# Patient Record
Sex: Male | Born: 1960 | Hispanic: Refuse to answer | Marital: Married | Smoking: Never smoker
Health system: Southern US, Community
[De-identification: ages and names within clinical notes are randomized; demographics above are authoritative.]

## PROBLEM LIST (undated history)

## (undated) DIAGNOSIS — G709 Myoneural disorder, unspecified: Secondary | ICD-10-CM

## (undated) DIAGNOSIS — I10 Essential (primary) hypertension: Secondary | ICD-10-CM

## (undated) HISTORY — DX: Essential (primary) hypertension: I10

## (undated) HISTORY — DX: Myoneural disorder, unspecified: G70.9

---

## 2008-02-03 ENCOUNTER — Emergency Department (HOSPITAL_BASED_OUTPATIENT_CLINIC_OR_DEPARTMENT_OTHER): Admission: EM | Admit: 2008-02-03 | Discharge: 2008-02-03 | Payer: Self-pay | Admitting: Emergency Medicine

## 2010-08-15 ENCOUNTER — Emergency Department (HOSPITAL_BASED_OUTPATIENT_CLINIC_OR_DEPARTMENT_OTHER)
Admission: EM | Admit: 2010-08-15 | Discharge: 2010-08-15 | Disposition: A | Discharge status: Home / Self Care | Payer: Self-pay | Attending: Emergency Medicine | Admitting: Emergency Medicine

## 2010-08-15 DIAGNOSIS — M25519 Pain in unspecified shoulder: Secondary | ICD-10-CM | POA: Insufficient documentation

## 2010-08-15 DIAGNOSIS — M5412 Radiculopathy, cervical region: Secondary | ICD-10-CM | POA: Insufficient documentation

## 2016-07-16 ENCOUNTER — Ambulatory Visit (INDEPENDENT_AMBULATORY_CARE_PROVIDER_SITE_OTHER): Payer: Self-pay

## 2016-07-16 ENCOUNTER — Ambulatory Visit (INDEPENDENT_AMBULATORY_CARE_PROVIDER_SITE_OTHER): Payer: Self-pay | Admitting: Physician Assistant

## 2016-07-16 VITALS — BP 168/98 | HR 85 | Temp 98.1°F

## 2016-07-16 DIAGNOSIS — R140 Abdominal distension (gaseous): Secondary | ICD-10-CM

## 2016-07-16 DIAGNOSIS — R109 Unspecified abdominal pain: Secondary | ICD-10-CM

## 2016-07-16 LAB — POCT CBC
Granulocyte percent: 50.7 % (ref 37–80)
HCT, POC: 43 % — AB (ref 43.5–53.7)
Hemoglobin: 14.5 g/dL (ref 14.1–18.1)
Lymph, poc: 1.3 (ref 0.6–3.4)
MCH, POC: 29.1 pg (ref 27–31.2)
MCHC: 33.8 g/dL (ref 31.8–35.4)
MCV: 86.2 fL (ref 80–97)
MID (cbc): 0.2 (ref 0–0.9)
MPV: 6.6 fL (ref 0–99.8)
POC Granulocyte: 1.6 — AB (ref 2–6.9)
POC LYMPH PERCENT: 41.8 % (ref 10–50)
POC MID %: 7.5 % (ref 0–12)
Platelet Count, POC: 261 10*3/uL (ref 142–424)
RBC: 4.99 M/uL (ref 4.69–6.13)
RDW, POC: 13.1 %
WBC: 3.2 10*3/uL — AB (ref 4.6–10.2)

## 2016-07-16 LAB — POCT URINALYSIS DIP (MANUAL ENTRY)
Bilirubin, UA: NEGATIVE
Blood, UA: NEGATIVE
Glucose, UA: NEGATIVE mg/dL
Ketones, POC UA: NEGATIVE mg/dL
Leukocytes, UA: NEGATIVE
Nitrite, UA: NEGATIVE
Protein Ur, POC: NEGATIVE mg/dL
Spec Grav, UA: 1.025 (ref 1.010–1.025)
Urobilinogen, UA: 0.2 U/dL
pH, UA: 6 (ref 5.0–8.0)

## 2016-07-16 LAB — GLUCOSE, POCT (MANUAL RESULT ENTRY): POC Glucose: 95 mg/dL (ref 70–99)

## 2016-07-16 MED ORDER — SIMETHICONE 80 MG PO TABS: 80.0000 mg | ORAL_TABLET | Freq: Three times a day (TID) | ORAL | 1 refills | Status: DC | PRN

## 2016-07-16 NOTE — Patient Instructions (Addendum)
I need you to check your blood pressure twice per week.  You can go to the pharmacy or you can purchase a blood pressure cuff.  If you find the blood pressure stays over 140/90, this is hypertension and we should treat.  I want you to return in 2-3 weeks.    Limiting dietary ingestion of known gas-producing foods such as cabbage, onions, broccoli, brussel sprouts, wheat, and potatoes should be recommended as a therapeutic trial. A low FODMAP diet is low in fermentable oligosaccharides, disaccharides, monosaccharides, and polyols. It has been demonstrated to reduce abdominal pain, bloating, and flatulence in patients with irritable bowel syndrome  Diet for Irritable Bowel Syndrome When you have irritable bowel syndrome (IBS), the foods you eat and your eating habits are very important. IBS may cause various symptoms, such as abdominal pain, constipation, or diarrhea. Choosing the right foods can help ease discomfort caused by these symptoms. Work with your health care provider and dietitian to find the best eating plan to help control your symptoms. What general guidelines do I need to follow?  Keep a food diary. This will help you identify foods that cause symptoms. Write down:  What you eat and when.  What symptoms you have.  When symptoms occur in relation to your meals.  Avoid foods that cause symptoms. Talk with your dietitian about other ways to get the same nutrients that are in these foods.  Eat more foods that contain fiber. Take a fiber supplement if directed by your dietitian.  Eat your meals slowly, in a relaxed setting.  Aim to eat 5-6 small meals per day. Do not skip meals.  Drink enough fluids to keep your urine clear or pale yellow.  Ask your health care provider if you should take an over-the-counter probiotic during flare-ups to help restore healthy gut bacteria.  If you have cramping or diarrhea, try making your meals low in fat and high in carbohydrates. Examples of  carbohydrates are pasta, rice, whole grain breads and cereals, fruits, and vegetables.  If dairy products cause your symptoms to flare up, try eating less of them. You might be able to handle yogurt better than other dairy products because it contains bacteria that help with digestion. What foods are not recommended? The following are some foods and drinks that may worsen your symptoms:  Fatty foods, such as [place] fries.  Milk products, such as cheese or ice cream.  Chocolate.  Alcohol.  Products with caffeine, such as coffee.  Carbonated drinks, such as soda. The items listed above may not be a complete list of foods and beverages to avoid. Contact your dietitian for more information.  What foods are good sources of fiber? Your health care provider or dietitian may recommend that you eat more foods that contain fiber. Fiber can help reduce constipation and other IBS symptoms. Add foods with fiber to your diet a little at a time so that your body can get used to them. Too much fiber at once might cause gas and swelling of your abdomen. The following are some foods that are good sources of fiber:  Apples.  Peaches.  Pears.  Berries.  Figs.  Broccoli (raw).  Cabbage.  Carrots.  Raw peas.  Kidney beans.  Lima beans.  Whole grain bread.  Whole grain cereal. Where to find more information: [employer] for Functional Gastrointestinal Disorders: www.iffgd.[employer] of Diabetes and Digestive and Kidney Diseases: http://norris-lawson.com/.aspx This information is not intended to replace advice given to you by  your health care provider. Make sure you discuss any questions you have with your health care provider. Document Released: 06/05/2003 Document Revised: 08/21/2015 Document Reviewed: 06/15/2013 Elsevier Interactive Patient Education  2017 [employer].    IF you received an x-ray  today, you will receive an invoice from [facility]. Please contact [facility] at [phone] with questions or concerns regarding your invoice.   IF you received labwork today, you will receive an invoice from [facility]. Please contact [facility] at [phone] with questions or concerns regarding your invoice.   Our billing staff will not be able to assist you with questions regarding bills from these companies.  You will be contacted with the lab results as soon as they are available. The fastest way to get your results is to activate your My Chart account. Instructions are located on the last page of this paperwork. If you have not heard from [place] regarding the results in 2 weeks, please contact this office.

## 2016-07-16 NOTE — Progress Notes (Signed)
PRIMARY CARE AT [facility] [address] [place] [address] [phone]  Date:  07/16/2016   Name:  Todd Maynard   DOB:  [birth year]   MRN:  [id number]  PCP:  No PCP Per Patient    History of Present Illness:  Todd Maynard is a 56 y.o. male patient who presents to PCP with  Chief Complaint  Patient presents with  . Abdominal Pain    sharp pain in the mid of stomach; pain comes strong and goes away; denies any N/V; no issues with eating  . Breathing Problem    states when pain comes it takes his breath away     Pain at the lower belly for more than 1 year.  Sharp pain that radiates to the lower right side that may last for 10 minutes.  He may lay on his right side which may help.  No nausea, dizziness, wakness, or sob associated withy symptoms.  At least once per day.  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  No known aggravation with lifting objects.  His stomach will get tight at times following meals.  He has not recognized foods that may initiate his symptoms. Never a diagnosis of htn, though he has not been to a doctor in a long time.  He has a hx of nervousness and anxiety.  No colonoscopy screening ever performed.  Denies black or bloody stool. He is new to the area.  He is here with his younger brother, who reports that he has a hx of "nerves".  Patient states that he is nervous, but he "don't know why".  He takes nothing for this.  He was dropped off by his wife to [place], and has not returned. No weight loss No etOH use No illicit drug use.   There are no active problems to display for this patient.   Past Medical History:  Diagnosis Date  . Neuromuscular disorder (HCC)     History reviewed. No pertinent surgical history.  Social History  Substance Use Topics  . Smoking status: Never Smoker  . Smokeless tobacco: Never Used  . Alcohol use Not on file    History reviewed. No pertinent family history.  No Known Allergies  Medication list has  been reviewed and updated.  No current outpatient prescriptions on file prior to visit.   No current facility-administered medications on file prior to visit.     ROS ROS otherwise unremarkable unless listed above.  Physical Examination: BP (!) 168/98   Pulse 85   Temp 98.1 F (36.7 C) (Oral)   SpO2 98%  Ideal Body Weight:    Physical Exam  Constitutional: He is oriented to person, place, and time. He appears well-developed and well-nourished. No distress.  HENT:  Head: Normocephalic and atraumatic.  Eyes: Conjunctivae and EOM are normal. Pupils are equal, round, and reactive to light.  Cardiovascular: Normal rate and regular rhythm.  Exam reveals no friction rub.   No murmur heard. Pulses:      Carotid pulses are 2+ on the right side, and 2+ on the left side.      Radial pulses are 2+ on the right side, and 2+ on the left side.       Dorsalis pedis pulses are 2+ on the right side, and 2+ on the left side.       Posterior tibial pulses are 2+ on the right side, and 2+ on the left side.  No extremity edema  Pulmonary/Chest: Effort  normal and breath sounds normal. No respiratory distress.  Abdominal: Soft. Normal appearance, normal aorta and bowel sounds are normal. He exhibits no abdominal bruit and no ascites. There is no hepatosplenomegaly. There is no tenderness. No hernia.  Neurological: He is alert and oriented to person, place, and time.  Skin: Skin is warm and dry. He is not diaphoretic.  Psychiatric: He has a normal mood and affect. His behavior is normal.  Mildly tremulous with a mild delay of response and speech pattern.   Results for orders placed or performed in visit on 07/16/16  POCT CBC  Result Value Ref Range   WBC 3.2 (A) 4.6 - 10.2 K/uL   Lymph, poc 1.3 0.6 - 3.4   POC LYMPH PERCENT 41.8 10 - 50 %L   MID (cbc) 0.2 0 - 0.9   POC MID % 7.5 0 - 12 %M   POC Granulocyte 1.6 (A) 2 - 6.9   Granulocyte percent 50.7 37 - 80 %G   RBC 4.99 4.69 - 6.13 M/uL    Hemoglobin 14.5 14.1 - 18.1 g/dL   HCT, POC 16.1 (A) 09.6 - 53.7 %   MCV 86.2 80 - 97 fL   MCH, POC 29.1 27 - 31.2 pg   MCHC 33.8 31.8 - 35.4 g/dL   RDW, POC 04.5 %   Platelet Count, POC 261 142 - 424 K/uL   MPV 6.6 0 - 99.8 fL  POCT urinalysis dipstick  Result Value Ref Range   Color, UA yellow yellow   Clarity, UA clear clear   Glucose, UA negative negative mg/dL   Bilirubin, UA negative negative   Ketones, POC UA negative negative mg/dL   Spec Grav, UA 4.098 1.191 - 1.025   Blood, UA negative negative   pH, UA 6.0 5.0 - 8.0   Protein Ur, POC negative negative mg/dL   Urobilinogen, UA 0.2 0.2 or 1.0 E.U./dL   Nitrite, UA Negative Negative   Leukocytes, UA Negative Negative  POCT glucose (manual entry)  Result Value Ref Range   POC Glucose 95 70 - 99 mg/dl   Dg Abd 1 View  Result Date: 07/16/2016 CLINICAL DATA:  Postprandial abdominal pain for 1 year EXAM: ABDOMEN - 1 VIEW COMPARISON:  None. FINDINGS: Scattered large and small bowel gas is noted. No abnormal mass or abnormal calcifications are noted. Mild degenerative changes of the lumbar spine are seen. IMPRESSION: No acute abnormality noted. Electronically Signed   By: Alcide Clever M.D.   On: 07/16/2016 12:27     Assessment and Plan: Willaim Maynard is a 56 y.o. male who is here today for cc of abdominal pain and here with elevated bp. --likely a hx of abdominal bloating that he is stating.  Possible food allergies.  I have given him and his older brother information on a low fodmap diet to decrease bowel gas.  Foods to avoid.   He will report to me a diet diary in 2 weeks, where he can note when he has had the abdominal pain and bloating. Prescribed simethicone tid prn. Brother is looking into attempting to obtain insurance for the pt.   In that 2 weeks, we can also see what his bp is, when he returns.  Will attempt to incorporate a bb.  Difficult to note whether his affect is anxiety vs cognitive delay--or mix.  Given  diagrams for fodmap and written try NOT to eat.   Abdominal pain, unspecified abdominal location - Plan: POCT CBC, POCT urinalysis dipstick, POCT  glucose (manual entry), DG Abd 1 View, Simethicone 80 MG TABS  Bloating - Plan: Simethicone 80 MG TABS  Trena Platt, PA-C [facility] 4/22/20187:35 AM  There are no diagnoses linked to this encounter.  Trena Platt, PA-C [facility] 07/18/2016 7:31 AM

## 2016-07-28 ENCOUNTER — Ambulatory Visit (INDEPENDENT_AMBULATORY_CARE_PROVIDER_SITE_OTHER): Payer: Self-pay | Admitting: Physician Assistant

## 2016-07-28 ENCOUNTER — Encounter: Payer: Self-pay | Admitting: Physician Assistant

## 2016-07-28 VITALS — BP 164/110 | HR 85 | Temp 97.7°F | Resp 18 | Wt 178.0 lb

## 2016-07-28 DIAGNOSIS — Z1211 Encounter for screening for malignant neoplasm of colon: Secondary | ICD-10-CM

## 2016-07-28 DIAGNOSIS — R140 Abdominal distension (gaseous): Secondary | ICD-10-CM

## 2016-07-28 DIAGNOSIS — I10 Essential (primary) hypertension: Secondary | ICD-10-CM

## 2016-07-28 LAB — CMP14+EGFR
ALT: 16 [IU]/L (ref 0–44)
AST: 16 [IU]/L (ref 0–40)
Albumin/Globulin Ratio: 1.5 (ref 1.2–2.2)
Albumin: 4.1 g/dL (ref 3.5–5.5)
Alkaline Phosphatase: 75 [IU]/L (ref 39–117)
BUN/Creatinine Ratio: 10 (ref 9–20)
BUN: 17 mg/dL (ref 6–24)
Bilirubin Total: 0.4 mg/dL (ref 0.0–1.2)
CO2: 25 mmol/L (ref 18–29)
Calcium: 9.4 mg/dL (ref 8.7–10.2)
Chloride: 105 mmol/L (ref 96–106)
Creatinine, Ser: 1.68 mg/dL — ABNORMAL HIGH (ref 0.76–1.27)
GFR calc Af Amer: 51 mL/min/{1.73_m2} — ABNORMAL LOW (ref >59)
GFR calc non Af Amer: 44 mL/min/{1.73_m2} — ABNORMAL LOW (ref >59)
Globulin, Total: 2.7 g/dL (ref 1.5–4.5)
Glucose: 86 mg/dL (ref 65–99)
Potassium: 3.7 mmol/L (ref 3.5–5.2)
Sodium: 146 mmol/L — ABNORMAL HIGH (ref 134–144)
Total Protein: 6.8 g/dL (ref 6.0–8.5)

## 2016-07-28 MED ORDER — AMLODIPINE BESYLATE 5 MG PO TABS: 5.0000 mg | ORAL_TABLET | Freq: Every day | ORAL | 1 refills | Status: DC

## 2016-07-28 NOTE — Progress Notes (Signed)
PRIMARY CARE AT [facility] [address], [place] [address] [phone]  Date:  07/28/2016   Name:  Todd Maynard   DOB:  04/24/1960   MRN:  [id number]  PCP:  No PCP Per Patient    History of Present Illness:  Todd Maynard is a 56 y.o. male patient who presents to PCP with  Chief Complaint  Patient presents with  . Follow-up    2 week     Patient is here today for 2 week follow up.  Patient was here for abdominal pain and bloating.  He notes that he has had some improvement of his pain, with one time where the pain had worsened.  He is paying attention to the low fodmap diet.  He did not pick up the simethicone at this time He continues to have elevated blood pressure.  He does not watch his sodium intake, but eats no canned or frozen dinners.  Drinks a lot of sweet teas.  No cp, palpitations, sob, or leg swellings.  No dizziness, or vision changes.  Occasional headache that does last but for hours.   There are no active problems to display for this patient.   Past Medical History:  Diagnosis Date  . Neuromuscular disorder ([place])     History reviewed. No pertinent surgical history.  Social History  Substance Use Topics  . Smoking status: Never Smoker  . Smokeless tobacco: Never Used  . Alcohol use Not on file    History reviewed. No pertinent family history.  No Known Allergies  Medication list has been reviewed and updated.  Current Outpatient Prescriptions on File Prior to Visit  Medication Sig Dispense Refill  . Simethicone 80 MG TABS Take 1 tablet (80 mg total) by mouth 3 (three) times daily as needed. 90 tablet 1   No current facility-administered medications on file prior to visit.     ROS ROS otherwise unremarkable unless listed above.  Physical Examination: BP (!) 181/121   Pulse 85   Temp 97.7 F (36.5 C) (Oral)   Resp 18   Wt 178 lb (80.7 kg)   SpO2 98%  Ideal Body Weight:    Physical Exam  Constitutional: He is oriented to person, place,  and time. He appears well-developed and well-nourished. No distress.  HENT:  Head: Normocephalic and atraumatic.  Eyes: Conjunctivae and EOM are normal. Pupils are equal, round, and reactive to light.  Cardiovascular: Normal rate, regular rhythm and normal heart sounds.  Exam reveals no gallop and no friction rub.   No murmur heard. Pulses:      Radial pulses are 2+ on the right side, and 2+ on the left side.       Dorsalis pedis pulses are 2+ on the right side, and 2+ on the left side.  Pulmonary/Chest: Effort normal. No respiratory distress.  Neurological: He is alert and oriented to person, place, and time.  Skin: Skin is warm and dry. He is not diaphoretic.  Psychiatric: He has a normal mood and affect. His behavior is normal.     Assessment and Plan: Todd Maynard is a 55 y.o. male who is here today for cc of follow up and here with continued elevated bp. Will continue low fod map diet.  Brother is attempting to help patient obtain insurance.  We will go ahead and submit for colonoscopy. Starting ccb  rtc in 2 weeks for bp follow up. Essential hypertension - Plan: CMP14+EGFR, amLODipine (NORVASC) 5 MG tablet  Special screening for malignant  neoplasms, colon - Plan: Ambulatory referral to Gastroenterology  Abdominal bloating - Plan: Ambulatory referral to Gastroenterology  Ivar Drape, PA-C [facility] 5/3/20188:14 AM

## 2016-07-28 NOTE — Patient Instructions (Addendum)
I would like you to pay attention to your your sodium intake.  I want you to continue to watch what your eating to avoid abdominal bloating, but I also want you to watch sodium as well.  I am including the dash diet.  Review this, on foods NOT to eat.  I want to see you in 2 weeks to recheck your blood pressure.   DASH Eating Plan DASH stands for "Dietary Approaches to Stop Hypertension." The DASH eating plan is a healthy eating plan that has been shown to reduce high blood pressure (hypertension). It may also reduce your risk for type 2 diabetes, heart disease, and stroke. The DASH eating plan may also help with weight loss. What are tips for following this plan? General guidelines   Avoid eating more than 2,300 mg (milligrams) of salt (sodium) a day. If you have hypertension, you may need to reduce your sodium intake to 1,500 mg a day.  Limit alcohol intake to no more than 1 drink a day for nonpregnant women and 2 drinks a day for men. One drink equals 12 oz of beer, 5 oz of wine, or 1 oz of hard liquor.  Work with your health care provider to maintain a healthy body weight or to lose weight. Ask what an ideal weight is for you.  Get at least 30 minutes of exercise that causes your heart to beat faster (aerobic exercise) most days of the week. Activities may include walking, swimming, or biking.  Work with your health care provider or diet and nutrition specialist (dietitian) to adjust your eating plan to your individual calorie needs. Reading food labels   Check food labels for the amount of sodium per serving. Choose foods with less than 5 percent of the Daily Value of sodium. Generally, foods with less than 300 mg of sodium per serving fit into this eating plan.  To find whole grains, look for the word "whole" as the first word in the ingredient list. Shopping   Buy products labeled as "low-sodium" or "no salt added."  Buy fresh foods. Avoid canned foods and premade or frozen  meals. Cooking   Avoid adding salt when cooking. Use salt-free seasonings or herbs instead of table salt or sea salt. Check with your health care provider or pharmacist before using salt substitutes.  Do not fry foods. Cook foods using healthy methods such as baking, boiling, grilling, and broiling instead.  Cook with heart-healthy oils, such as olive, canola, soybean, or sunflower oil. Meal planning    Eat a balanced diet that includes:  5 or more servings of fruits and vegetables each day. At each meal, try to fill half of your plate with fruits and vegetables.  Up to 6-8 servings of whole grains each day.  Less than 6 oz of lean meat, poultry, or fish each day. A 3-oz serving of meat is about the same size as a deck of cards. One egg equals 1 oz.  2 servings of low-fat dairy each day.  A serving of nuts, seeds, or beans 5 times each week.  Heart-healthy fats. Healthy fats called Omega-3 fatty acids are found in foods such as flaxseeds and coldwater fish, like sardines, salmon, and mackerel.  Limit how much you eat of the following:  Canned or prepackaged foods.  Food that is high in trans fat, such as fried foods.  Food that is high in saturated fat, such as fatty meat.  Sweets, desserts, sugary drinks, and other foods with added sugar.  Full-fat dairy products.  Do not salt foods before eating.  Try to eat at least 2 vegetarian meals each week.  Eat more home-cooked food and less restaurant, buffet, and fast food.  When eating at a restaurant, ask that your food be prepared with less salt or no salt, if possible. What foods are recommended? The items listed may not be a complete list. Talk with your dietitian about what dietary choices are best for you. Grains  Whole-grain or whole-wheat bread. Whole-grain or whole-wheat pasta. Brown rice. [place]. Bulgur. Whole-grain and low-sodium cereals. Pita bread. Low-fat, low-sodium crackers. Whole-wheat flour  tortillas. Vegetables  Fresh or frozen vegetables (raw, steamed, roasted, or grilled). Low-sodium or reduced-sodium tomato and vegetable juice. Low-sodium or reduced-sodium tomato sauce and tomato paste. Low-sodium or reduced-sodium canned vegetables. Fruits  All fresh, dried, or frozen fruit. Canned fruit in natural juice (without added sugar). Meat and other protein foods  Skinless chicken or [place]. Ground chicken or [place]. Pork with fat trimmed off. Fish and seafood. Egg whites. Dried beans, peas, or lentils. Unsalted nuts, nut butters, and seeds. Unsalted canned beans. Lean cuts of beef with fat trimmed off. Low-sodium, lean deli meat. Dairy  Low-fat (1%) or fat-free (skim) milk. Fat-free, low-fat, or reduced-fat cheeses. Nonfat, low-sodium ricotta or cottage cheese. Low-fat or nonfat yogurt. Low-fat, low-sodium cheese. Fats and oils  Soft margarine without trans fats. Vegetable oil. Low-fat, reduced-fat, or light mayonnaise and salad dressings (reduced-sodium). Canola, safflower, olive, soybean, and sunflower oils. Avocado. Seasoning and other foods  Herbs. Spices. Seasoning mixes without salt. Unsalted popcorn and pretzels. Fat-free sweets. What foods are not recommended? The items listed may not be a complete list. Talk with your dietitian about what dietary choices are best for you. Grains  Baked goods made with fat, such as croissants, muffins, or some breads. Dry pasta or rice meal packs. Vegetables  Creamed or fried vegetables. Vegetables in a cheese sauce. Regular canned vegetables (not low-sodium or reduced-sodium). Regular canned tomato sauce and paste (not low-sodium or reduced-sodium). Regular tomato and vegetable juice (not low-sodium or reduced-sodium). Angie Fava. Olives. Fruits  Canned fruit in a light or heavy syrup. Fried fruit. Fruit in cream or butter sauce. Meat and other protein foods  Fatty cuts of meat. Ribs. Fried meat. Berniece Salines. Sausage. Bologna and other processed  lunch meats. Salami. Fatback. Hotdogs. Bratwurst. Salted nuts and seeds. Canned beans with added salt. Canned or smoked fish. Whole eggs or egg yolks. Chicken or [place] with skin. Dairy  Whole or 2% milk, cream, and half-and-half. Whole or full-fat cream cheese. Whole-fat or sweetened yogurt. Full-fat cheese. Nondairy creamers. Whipped toppings. Processed cheese and cheese spreads. Fats and oils  Butter. Stick margarine. Lard. Shortening. Ghee. Bacon fat. Tropical oils, such as coconut, palm kernel, or palm oil. Seasoning and other foods  Salted popcorn and pretzels. Onion salt, garlic salt, seasoned salt, table salt, and sea salt. Worcestershire sauce. Tartar sauce. Barbecue sauce. Teriyaki sauce. Soy sauce, including reduced-sodium. Steak sauce. Canned and packaged gravies. Fish sauce. Oyster sauce. Cocktail sauce. Horseradish that you find on the shelf. Ketchup. Mustard. Meat flavorings and tenderizers. Bouillon cubes. Hot sauce and Tabasco sauce. Premade or packaged marinades. Premade or packaged taco seasonings. Relishes. Regular salad dressings. Where to find more information:  National Heart, Lung, and [place]: https://wilson-eaton.com/  American Heart Association: www.heart.org Summary  The DASH eating plan is a healthy eating plan that has been shown to reduce high blood pressure (hypertension). It may also reduce your risk for  type 2 diabetes, heart disease, and stroke.  With the DASH eating plan, you should limit salt (sodium) intake to 2,300 mg a day. If you have hypertension, you may need to reduce your sodium intake to 1,500 mg a day.  When on the DASH eating plan, aim to eat more fresh fruits and vegetables, whole grains, lean proteins, low-fat dairy, and heart-healthy fats.  Work with your health care provider or diet and nutrition specialist (dietitian) to adjust your eating plan to your individual calorie needs. This information is not intended to replace advice given to you by  your health care provider. Make sure you discuss any questions you have with your health care provider. Document Released: 03/04/2011 Document Revised: 03/08/2016 Document Reviewed: 03/08/2016 Elsevier Interactive Patient Education  2017 [employer].       IF you received an x-ray today, you will receive an invoice from [facility]. Please contact [facility] at [phone] with questions or concerns regarding your invoice.   IF you received labwork today, you will receive an invoice from [place]. Please contact [facility] at [phone] with questions or concerns regarding your invoice.   Our billing staff will not be able to assist you with questions regarding bills from these companies.  You will be contacted with the lab results as soon as they are available. The fastest way to get your results is to activate your My Chart account. Instructions are located on the last page of this paperwork. If you have not heard from [place] regarding the results in 2 weeks, please contact this office.

## 2016-08-11 ENCOUNTER — Ambulatory Visit: Payer: Self-pay | Admitting: Physician Assistant

## 2016-08-12 ENCOUNTER — Ambulatory Visit (INDEPENDENT_AMBULATORY_CARE_PROVIDER_SITE_OTHER): Payer: Self-pay | Admitting: Physician Assistant

## 2016-08-12 ENCOUNTER — Encounter: Payer: Self-pay | Admitting: Physician Assistant

## 2016-08-12 VITALS — BP 162/98 | HR 83 | Temp 98.6°F | Resp 18 | Wt 179.0 lb

## 2016-08-12 DIAGNOSIS — R140 Abdominal distension (gaseous): Secondary | ICD-10-CM

## 2016-08-12 DIAGNOSIS — I10 Essential (primary) hypertension: Secondary | ICD-10-CM

## 2016-08-12 NOTE — Patient Instructions (Signed)
     IF you received an x-ray today, you will receive an invoice from [place] [facility]. Please contact  Radiology at [phone] with questions or concerns regarding your invoice.   IF you received labwork today, you will receive an invoice from [facility]. Please contact [facility] at [phone] with questions or concerns regarding your invoice.   Our billing staff will not be able to assist you with questions regarding bills from these companies.  You will be contacted with the lab results as soon as they are available. The fastest way to get your results is to activate your My Chart account. Instructions are located on the last page of this paperwork. If you have not heard from us regarding the results in 2 weeks, please contact this office.     

## 2016-08-14 NOTE — Progress Notes (Signed)
PRIMARY CARE AT [facility] [address], [place] [id number] [phone]  Date:  08/12/2016   Name:  Todd HoveJames Reser   DOB:  03-17-1961   MRN:  [id number]  PCP:  Patient, No Pcp Per    History of Present Illness:  Todd HoveJames Upham is a 56 y.o. male patient who presents to PCP with  Chief Complaint  Patient presents with  . Hypertension     Patient is here for blood pressure recheck, recheck of abdominla pain.  Patient recently picked up the simethicone, as they did not understand that this was at the pharmacy.  Patient states that he has no abdominal pain after starting this medication.  Patient has been compliant with taking the blood pressure medication.  He is not checking his blood pressure.   He denies any chest pains, palpitaitons, leg swellings, or vision changes.   There are no active problems to display for this patient.   Past Medical History:  Diagnosis Date  . Neuromuscular disorder (HCC)     No past surgical history on file.  Social History  Substance Use Topics  . Smoking status: Never Smoker  . Smokeless tobacco: Never Used  . Alcohol use Not on file    No family history on file.  No Known Allergies  Medication list has been reviewed and updated.  Current Outpatient Prescriptions on File Prior to Visit  Medication Sig Dispense Refill  . amLODipine (NORVASC) 5 MG tablet Take 1 tablet (5 mg total) by mouth daily. 30 tablet 1  . Simethicone 80 MG TABS Take 1 tablet (80 mg total) by mouth 3 (three) times daily as needed. 90 tablet 1   No current facility-administered medications on file prior to visit.     ROS ROS otherwise unremarkable unless listed above.  Physical Examination: BP (!) 162/98   Pulse 83   Temp 98.6 F (37 C) (Oral)   Resp 18   Wt 179 lb (81.2 kg)   SpO2 99%  Ideal Body Weight:    Physical Exam  Constitutional: He is oriented to person, place, and time. He appears well-developed and well-nourished. No distress.  HENT:  Head:  Normocephalic and atraumatic.  Eyes: Conjunctivae and EOM are normal. Pupils are equal, round, and reactive to light.  Cardiovascular: Normal rate, regular rhythm and normal heart sounds.  Exam reveals no gallop, no distant heart sounds and no friction rub.   No murmur heard. Pulses:      Carotid pulses are 2+ on the right side, and 2+ on the left side.      Radial pulses are 2+ on the right side, and 2+ on the left side.       Dorsalis pedis pulses are 2+ on the right side, and 2+ on the left side.  Pulmonary/Chest: Effort normal. No respiratory distress.  Neurological: He is alert and oriented to person, place, and time.  Skin: Skin is warm and dry. He is not diaphoretic.  Psychiatric: He has a normal mood and affect. His behavior is normal.     Assessment and Plan: Todd Maynard is a 56 y.o. male who is here today for cc of bp recheck, and abdominal pain This has resolved. bp still elevated.  There is potential that there is a white coat aspect to patient here.  Advised to check out of office and record twice per week Return with these labs. Essential hypertension  Abdominal bloating  Trena PlattStephanie Serinity Ware, PA-C [facility]  5/19/20189:43 AM

## 2016-08-26 ENCOUNTER — Telehealth: Payer: Self-pay | Admitting: Physician Assistant

## 2016-08-26 NOTE — Telephone Encounter (Signed)
Pt is having high bl pressure last week on Wed he was at 168/100 & Thurs of last week it was 158/90 so it has went down a little. He was explaining that he takes 1 amlodipine a day for the bl pressure, his brother was just concerned about the high bl pressure. Was asking if someone could give him a call or give his brother a call ASAP.   His brother is Ethelene Brownsnthony [phone]  Please advise

## 2016-08-27 NOTE — Telephone Encounter (Signed)
Feels good on med but b/p is still high.  He has been on meds x 1 month.  Denies chest pain or sob  Please advise any concerns And next ov needed

## 2016-08-31 NOTE — Telephone Encounter (Signed)
Ethelene Brownsnthony advised, he will bring his cuff here for us to compare

## 2016-08-31 NOTE — Telephone Encounter (Signed)
He should return with blood pressure cuff, and have a nurse check and insure that this is calibrated correctly.  Please have him return for this, and report the blood pressure to me.  Make sure it is appropriately sized and that he is resting more than 2 minutes, prior to blood pressure check.  Check manually.

## 2016-09-25 ENCOUNTER — Other Ambulatory Visit: Payer: Self-pay | Admitting: Physician Assistant

## 2016-09-25 DIAGNOSIS — I10 Essential (primary) hypertension: Secondary | ICD-10-CM

## 2016-09-30 ENCOUNTER — Encounter: Payer: Self-pay | Admitting: Physician Assistant

## 2016-12-25 ENCOUNTER — Other Ambulatory Visit: Payer: Self-pay | Admitting: Physician Assistant

## 2016-12-25 DIAGNOSIS — I10 Essential (primary) hypertension: Secondary | ICD-10-CM

## 2016-12-25 NOTE — Telephone Encounter (Signed)
Refill request for amlodipine 5 mg #30 with 1 refill approved.  Pt needs f/u bp ov.  Will send to schedulers to schedule appt.

## 2017-01-24 ENCOUNTER — Ambulatory Visit (INDEPENDENT_AMBULATORY_CARE_PROVIDER_SITE_OTHER): Payer: Self-pay | Admitting: Physician Assistant

## 2017-01-24 ENCOUNTER — Encounter: Payer: Self-pay | Admitting: Physician Assistant

## 2017-01-24 VITALS — BP 168/98 | HR 82 | Temp 98.4°F | Resp 16 | Ht 66.0 in | Wt 185.0 lb

## 2017-01-24 DIAGNOSIS — Z23 Encounter for immunization: Secondary | ICD-10-CM

## 2017-01-24 DIAGNOSIS — I10 Essential (primary) hypertension: Secondary | ICD-10-CM

## 2017-01-24 MED ORDER — LOSARTAN POTASSIUM 50 MG PO TABS: 50.0000 mg | ORAL_TABLET | Freq: Every day | ORAL | 2 refills | Status: DC

## 2017-01-24 MED ORDER — AMLODIPINE BESYLATE 5 MG PO TABS: ORAL_TABLET | ORAL | 2 refills | Status: DC

## 2017-01-24 NOTE — Patient Instructions (Addendum)
We are adding a blood pressure medication.  In one week, start rechecking the blood pressure.  If this continues to stay >140/90, I would like you to let me know.  We will plan to meet in 1 month for recheck of the blood pressure. We will do the losartan instead, as this may be more efficacious long term.  DASH Eating Plan DASH stands for "Dietary Approaches to Stop Hypertension." The DASH eating plan is a healthy eating plan that has been shown to reduce high blood pressure (hypertension). It may also reduce your risk for type 2 diabetes, heart disease, and stroke. The DASH eating plan may also help with weight loss. What are tips for following this plan? General guidelines  Avoid eating more than 2,300 mg (milligrams) of salt (sodium) a day. If you have hypertension, you may need to reduce your sodium intake to 1,500 mg a day.  Limit alcohol intake to no more than 1 drink a day for nonpregnant women and 2 drinks a day for men. One drink equals 12 oz of beer, 5 oz of wine, or 1 oz of hard liquor.  Work with your health care provider to maintain a healthy body weight or to lose weight. Ask what an ideal weight is for you.  Get at least 30 minutes of exercise that causes your heart to beat faster (aerobic exercise) most days of the week. Activities may include walking, swimming, or biking.  Work with your health care provider or diet and nutrition specialist (dietitian) to adjust your eating plan to your individual calorie needs. Reading food labels  Check food labels for the amount of sodium per serving. Choose foods with less than 5 percent of the Daily Value of sodium. Generally, foods with less than 300 mg of sodium per serving fit into this eating plan.  To find whole grains, look for the word "whole" as the first word in the ingredient list. Shopping  Buy products labeled as "low-sodium" or "no salt added."  Buy fresh foods. Avoid canned foods and premade or frozen  meals. Cooking  Avoid adding salt when cooking. Use salt-free seasonings or herbs instead of table salt or sea salt. Check with your health care provider or pharmacist before using salt substitutes.  Do not fry foods. Cook foods using healthy methods such as baking, boiling, grilling, and broiling instead.  Cook with heart-healthy oils, such as olive, canola, soybean, or sunflower oil. Meal planning   Eat a balanced diet that includes: ? 5 or more servings of fruits and vegetables each day. At each meal, try to fill half of your plate with fruits and vegetables. ? Up to 6-8 servings of whole grains each day. ? Less than 6 oz of lean meat, poultry, or fish each day. A 3-oz serving of meat is about the same size as a deck of cards. One egg equals 1 oz. ? 2 servings of low-fat dairy each day. ? A serving of nuts, seeds, or beans 5 times each week. ? Heart-healthy fats. Healthy fats called Omega-3 fatty acids are found in foods such as flaxseeds and coldwater fish, like sardines, salmon, and mackerel.  Limit how much you eat of the following: ? Canned or prepackaged foods. ? Food that is high in trans fat, such as fried foods. ? Food that is high in saturated fat, such as fatty meat. ? Sweets, desserts, sugary drinks, and other foods with added sugar. ? Full-fat dairy products.  Do not salt foods before eating.  Try to eat at least 2 vegetarian meals each week.  Eat more home-cooked food and less restaurant, buffet, and fast food.  When eating at a restaurant, ask that your food be prepared with less salt or no salt, if possible. What foods are recommended? The items listed may not be a complete list. Talk with your dietitian about what dietary choices are best for you. Grains Whole-grain or whole-wheat bread. Whole-grain or whole-wheat pasta. Brown rice. [place]ena Morrow. Bulgur. Whole-grain and low-sodium cereals. Pita bread. Low-fat, low-sodium crackers. Whole-wheat flour  tortillas. Vegetables Fresh or frozen vegetables (raw, steamed, roasted, or grilled). Low-sodium or reduced-sodium tomato and vegetable juice. Low-sodium or reduced-sodium tomato sauce and tomato paste. Low-sodium or reduced-sodium canned vegetables. Fruits All fresh, dried, or frozen fruit. Canned fruit in natural juice (without added sugar). Meat and other protein foods Skinless chicken or [place]. Ground chicken or [place]. Pork with fat trimmed off. Fish and seafood. Egg whites. Dried beans, peas, or lentils. Unsalted nuts, nut butters, and seeds. Unsalted canned beans. Lean cuts of beef with fat trimmed off. Low-sodium, lean deli meat. Dairy Low-fat (1%) or fat-free (skim) milk. Fat-free, low-fat, or reduced-fat cheeses. Nonfat, low-sodium ricotta or cottage cheese. Low-fat or nonfat yogurt. Low-fat, low-sodium cheese. Fats and oils Soft margarine without trans fats. Vegetable oil. Low-fat, reduced-fat, or light mayonnaise and salad dressings (reduced-sodium). Canola, safflower, olive, soybean, and sunflower oils. Avocado. Seasoning and other foods Herbs. Spices. Seasoning mixes without salt. Unsalted popcorn and pretzels. Fat-free sweets. What foods are not recommended? The items listed may not be a complete list. Talk with your dietitian about what dietary choices are best for you. Grains Baked goods made with fat, such as croissants, muffins, or some breads. Dry pasta or rice meal packs. Vegetables Creamed or fried vegetables. Vegetables in a cheese sauce. Regular canned vegetables (not low-sodium or reduced-sodium). Regular canned tomato sauce and paste (not low-sodium or reduced-sodium). Regular tomato and vegetable juice (not low-sodium or reduced-sodium). Angie Fava. Olives. Fruits Canned fruit in a light or heavy syrup. Fried fruit. Fruit in cream or butter sauce. Meat and other protein foods Fatty cuts of meat. Ribs. Fried meat. Berniece Salines. Sausage. Bologna and other processed lunch meats.  Salami. Fatback. Hotdogs. Bratwurst. Salted nuts and seeds. Canned beans with added salt. Canned or smoked fish. Whole eggs or egg yolks. Chicken or [place] with skin. Dairy Whole or 2% milk, cream, and half-and-half. Whole or full-fat cream cheese. Whole-fat or sweetened yogurt. Full-fat cheese. Nondairy creamers. Whipped toppings. Processed cheese and cheese spreads. Fats and oils Butter. Stick margarine. Lard. Shortening. Ghee. Bacon fat. Tropical oils, such as coconut, palm kernel, or palm oil. Seasoning and other foods Salted popcorn and pretzels. Onion salt, garlic salt, seasoned salt, table salt, and sea salt. Worcestershire sauce. Tartar sauce. Barbecue sauce. Teriyaki sauce. Soy sauce, including reduced-sodium. Steak sauce. Canned and packaged gravies. Fish sauce. Oyster sauce. Cocktail sauce. Horseradish that you find on the shelf. Ketchup. Mustard. Meat flavorings and tenderizers. Bouillon cubes. Hot sauce and Tabasco sauce. Premade or packaged marinades. Premade or packaged taco seasonings. Relishes. Regular salad dressings. Where to find more information:  National Heart, Lung, and [place]: https://wilson-eaton.com/  American Heart Association: www.heart.org Summary  The DASH eating plan is a healthy eating plan that has been shown to reduce high blood pressure (hypertension). It may also reduce your risk for type 2 diabetes, heart disease, and stroke.  With the DASH eating plan, you should limit salt (sodium) intake to 2,300 mg a day. If  you have hypertension, you may need to reduce your sodium intake to 1,500 mg a day.  When on the DASH eating plan, aim to eat more fresh fruits and vegetables, whole grains, lean proteins, low-fat dairy, and heart-healthy fats.  Work with your health care provider or diet and nutrition specialist (dietitian) to adjust your eating plan to your individual calorie needs. This information is not intended to replace advice given to you by your health  care provider. Make sure you discuss any questions you have with your health care provider. Document Released: 03/04/2011 Document Revised: 03/08/2016 Document Reviewed: 03/08/2016 Elsevier Interactive Patient Education  2017 [employer]    IF you received an x-ray today, you will receive an invoice from [facility]. Please contact [facility] at [phone] with questions or concerns regarding your invoice.   IF you received labwork today, you will receive an invoice from [facility]. Please contact [facility] at [phone] with questions or concerns regarding your invoice.   Our billing staff will not be able to assist you with questions regarding bills from these companies.  You will be contacted with the lab results as soon as they are available. The fastest way to get your results is to activate your My Chart account. Instructions are located on the last page of this paperwork. If you have not heard from us regarding the results in 2 weeks, please contact this office.    Losartan tablets What is this medicine? LOSARTAN (loe SAR tan) is used to treat high blood pressure and to reduce the risk of stroke in certain patients. This drug also slows the progression of kidney disease in patients with diabetes. This medicine may be used for other purposes; ask your health care provider or pharmacist if you have questions. COMMON BRAND NAME(S): Cozaar What should I tell my health care provider before I take this medicine? They need to know if you have any of these conditions: -heart failure -kidney or liver disease -an unusual or allergic reaction to losartan, other medicines, foods, dyes, or preservatives -pregnant or trying to get pregnant -breast-feeding How should I use this medicine? Take this medicine by mouth with a glass of water. Follow the directions on the prescription label. This medicine can be taken with or without food. Take your doses at regular intervals. Do  not take your medicine more often than directed. Talk to your pediatrician regarding the use of this medicine in children. Special care may be needed. Overdosage: If you think you have taken too much of this medicine contact a poison control center or emergency room at once. NOTE: This medicine is only for you. Do not share this medicine with others. What if I miss a dose? If you miss a dose, take it as soon as you can. If it is almost time for your next dose, take only that dose. Do not take double or extra doses. What may interact with this medicine? -blood pressure medicines -diuretics, especially triamterene, spironolactone, or amiloride -fluconazole -NSAIDs, medicines for pain and inflammation, like ibuprofen or naproxen -potassium salts or potassium supplements -rifampin This list may not describe all possible interactions. Give your health care provider a list of all the medicines, herbs, non-prescription drugs, or dietary supplements you use. Also tell them if you smoke, drink alcohol, or use illegal drugs. Some items may interact with your medicine. What should I watch for while using this medicine? Visit your doctor or health care professional for regular checks on your progress. Check your blood pressure  as directed. Ask your doctor or health care professional what your blood pressure should be and when you should contact him or her. Call your doctor or health care professional if you notice an irregular or fast heart beat. Women should inform their doctor if they wish to become pregnant or think they might be pregnant. There is a potential for serious side effects to an unborn child, particularly in the second or third trimester. Talk to your health care professional or pharmacist for more information. You may get drowsy or dizzy. Do not drive, use machinery, or do anything that needs mental alertness until you know how this drug affects you. Do not stand or sit up quickly, especially if  you are an older patient. This reduces the risk of dizzy or fainting spells. Alcohol can make you more drowsy and dizzy. Avoid alcoholic drinks. Avoid salt substitutes unless you are told otherwise by your doctor or health care professional. Do not treat yourself for coughs, colds, or pain while you are taking this medicine without asking your doctor or health care professional for advice. Some ingredients may increase your blood pressure. What side effects may I notice from receiving this medicine? Side effects that you should report to your doctor or health care professional as soon as possible: -confusion, dizziness, light headedness or fainting spells -decreased amount of urine passed -difficulty breathing or swallowing, hoarseness, or tightening of the throat -fast or irregular heart beat, palpitations, or chest pain -skin rash, itching -swelling of your face, lips, tongue, hands, or feet Side effects that usually do not require medical attention (report to your doctor or health care professional if they continue or are bothersome): -cough -decreased sexual function or desire -headache -nasal congestion or stuffiness -nausea or stomach pain -sore or cramping muscles This list may not describe all possible side effects. Call your doctor for medical advice about side effects. You may report side effects to FDA at 1-800-FDA-1088. Where should I keep my medicine? Keep out of the reach of children. Store at room temperature between 15 and 30 degrees C (59 and 86 degrees F). Protect from light. Keep container tightly closed. Throw away any unused medicine after the expiration date. NOTE: This sheet is a summary. It may not cover all possible information. If you have questions about this medicine, talk to your doctor, pharmacist, or health care provider.  2018 Elsevier/Gold Standard (2007-05-26 16:42:18)

## 2017-01-24 NOTE — Progress Notes (Signed)
PRIMARY CARE AT [facility] [address] [place] [address] [phone]  Date:  01/24/2017   Name:  Todd Maynard   DOB:  1960/12/12   MRN:  [id number]  PCP:  Patient, No Pcp Per    History of Present Illness:  Todd Maynard is a 56 y.o. male patient who presents to PCP with  Chief Complaint  Patient presents with  . Medication Refill    amlodipine     Patient noted that he is taking his medication compliantly.  He is here with his brother today. He states that he feels better on the medication.  He is not taking his bp at home.  His wife just moved back in who is in nursing.  He has a new cuff from his brother. Patient denies chest pains, palpitaitons, sob, or leg swelling Bike rides for about 3 hours at least 4 times per day Drinks only water No canned foods No etOH use.   Wt Readings from Last 3 Encounters:  01/24/17 185 lb (83.9 kg)  08/12/16 179 lb (81.2 kg)  07/28/16 178 lb (80.7 kg)       There are no active problems to display for this patient.   Past Medical History:  Diagnosis Date  . Neuromuscular disorder (HCC)     History reviewed. No pertinent surgical history.  Social History  Substance Use Topics  . Smoking status: Never Smoker  . Smokeless tobacco: Never Used  . Alcohol use Not on file    History reviewed. No pertinent family history.  No Known Allergies  Medication list has been reviewed and updated.  Current Outpatient Prescriptions on File Prior to Visit  Medication Sig Dispense Refill  . Simethicone 80 MG TABS Take 1 tablet (80 mg total) by mouth 3 (three) times daily as needed. 90 tablet 1   No current facility-administered medications on file prior to visit.     ROS ROS otherwise unremarkable unless listed above.  Physical Examination: BP (!) 168/98   Pulse 82   Temp 98.4 F (36.9 C) (Oral)   Resp 16   Ht 5\' 6"  (1.676 m)   Wt 185 lb (83.9 kg)   SpO2 97%   BMI 29.86 kg/m  Ideal Body Weight: Weight in (lb) to have  BMI = 25: 154.6  Physical Exam  Constitutional: He is oriented to person, place, and time. He appears well-developed and well-nourished. No distress.  HENT:  Head: Normocephalic and atraumatic.  Eyes: Pupils are equal, round, and reactive to light. Conjunctivae and EOM are normal.  Cardiovascular: Normal rate and regular rhythm.   No murmur heard. Pulmonary/Chest: Effort normal. No respiratory distress.  Neurological: He is alert and oriented to person, place, and time.  Skin: Skin is warm and dry. He is not diaphoretic.  Psychiatric: He has a normal mood and affect. His behavior is normal. Cognition and memory are impaired (minimal and his normal behaviour--slow speech.  delayed response.).     Assessment and Plan: Todd Maynard is a 56 y.o. male who is here today for cc of  Chief Complaint  Patient presents with  . Medication Refill    amlodipine  he has an anxious countenance.   However I will add a second bp medication.  Rtc in 1 month Side effects given to warrant alarming symptoms. Dash diet given Essential hypertension - Plan: amLODipine (NORVASC) 5 MG tablet, losartan (COZAAR) 50 MG tablet  Need for influenza vaccination - Plan: Flu Vaccine QUAD 36+ mos IM  Judeth Cornfield  Lenox PondsEnglish, PA-C [facility] 10/29/201810:03 AM

## 2017-02-28 ENCOUNTER — Ambulatory Visit: Payer: Self-pay | Admitting: Physician Assistant

## 2017-05-12 ENCOUNTER — Other Ambulatory Visit: Payer: Self-pay | Admitting: Physician Assistant

## 2017-05-12 DIAGNOSIS — I10 Essential (primary) hypertension: Secondary | ICD-10-CM

## 2017-06-16 ENCOUNTER — Other Ambulatory Visit: Payer: Self-pay | Admitting: Physician Assistant

## 2017-06-16 DIAGNOSIS — I10 Essential (primary) hypertension: Secondary | ICD-10-CM

## 2017-06-27 ENCOUNTER — Encounter: Payer: Self-pay | Admitting: Physician Assistant

## 2017-07-13 ENCOUNTER — Other Ambulatory Visit: Payer: Self-pay | Admitting: Physician Assistant

## 2017-07-13 DIAGNOSIS — I10 Essential (primary) hypertension: Secondary | ICD-10-CM

## 2018-05-12 IMAGING — DX DG ABDOMEN 1V
1 series · 1 of 1 positions shown · non-contrast
Comparison: None.

CLINICAL DATA: Postprandial abdominal pain for 1 year

EXAM:
ABDOMEN - 1 VIEW

[abdomen kub]
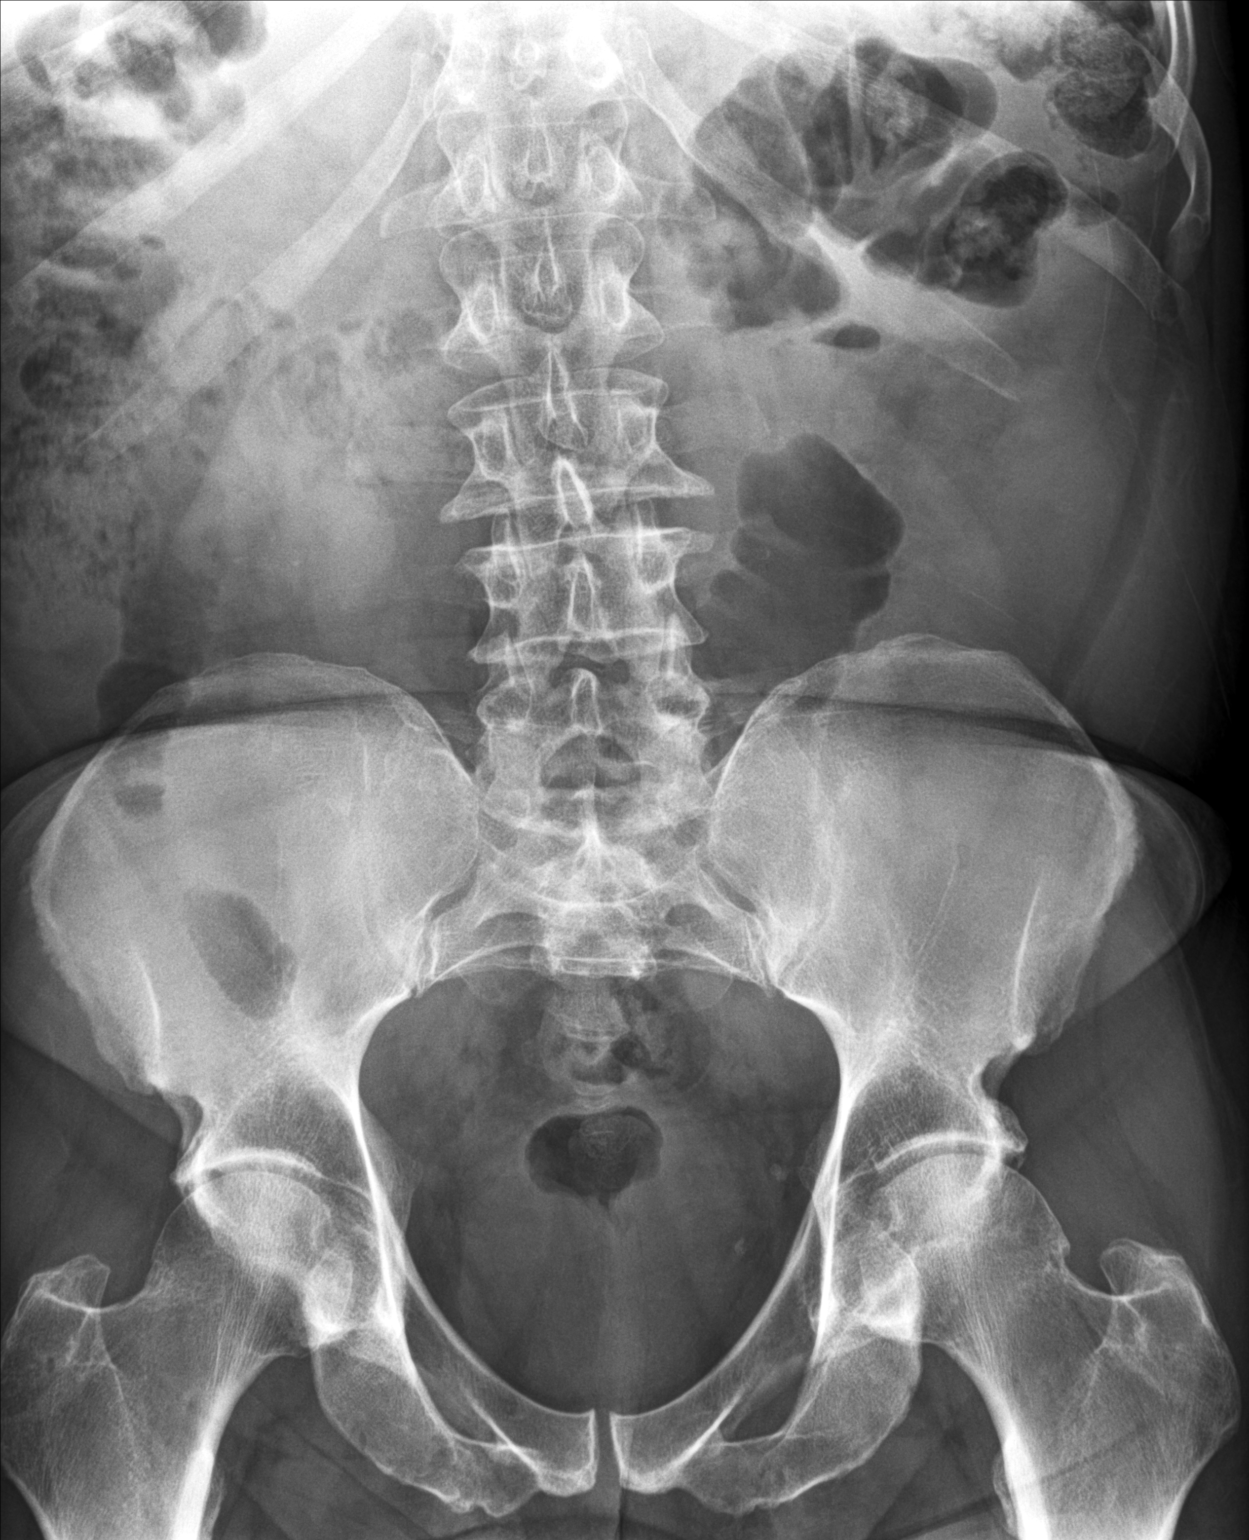

[1 of 1 positions shown; findings below may reference images not displayed]

FINDINGS: Scattered large and small bowel gas is noted. No abnormal mass or
abnormal calcifications are noted. Mild degenerative changes of the
lumbar spine are seen.
IMPRESSION: No acute abnormality noted.

## 2018-09-07 ENCOUNTER — Ambulatory Visit: Payer: Self-pay | Admitting: Family Medicine

## 2018-09-07 ENCOUNTER — Encounter: Payer: Self-pay | Admitting: Family Medicine

## 2018-09-07 ENCOUNTER — Other Ambulatory Visit: Payer: Self-pay

## 2018-09-07 VITALS — BP 202/122 | HR 93 | Temp 98.1°F | Ht 66.0 in | Wt 181.0 lb

## 2018-09-07 DIAGNOSIS — I10 Essential (primary) hypertension: Secondary | ICD-10-CM | POA: Insufficient documentation

## 2018-09-07 DIAGNOSIS — H6123 Impacted cerumen, bilateral: Secondary | ICD-10-CM | POA: Insufficient documentation

## 2018-09-07 MED ORDER — LOSARTAN POTASSIUM 50 MG PO TABS: 50.0000 mg | ORAL_TABLET | Freq: Every day | ORAL | 0 refills | Status: DC

## 2018-09-07 MED ORDER — AMLODIPINE BESYLATE 5 MG PO TABS: ORAL_TABLET | ORAL | 0 refills | Status: DC

## 2018-09-07 NOTE — Progress Notes (Signed)
Established Patient Office Visit  Subjective:  Patient ID: Todd Maynard, male    DOB: 29-Oct-1960  Age: 58 y.o. MRN: [id number]  CC:  Chief Complaint  Patient presents with  . Cerumen Impaction    both ears. cant hear well out of right side    HPI Todd Maynard presents for cerumen removal Pt is not taking blood pressure medication-no refills Pt states he has been out of medication for a year Past Medical History:  Diagnosis Date  . Neuromuscular disorder ([place])    No past surgical history on file.  No family history on file.  Social History   Socioeconomic History  . Marital status: Married    Spouse name: Not on file  . Number of children: Not on file  . Years of education: Not on file  . Highest education level: Not on file  Occupational History  . Not on file  Social Needs  . Financial resource strain: Not on file  . Food insecurity    Worry: Not on file    Inability: Not on file  . Transportation needs    Medical: Not on file    Non-medical: Not on file  Tobacco Use  . Smoking status: Never Smoker  . Smokeless tobacco: Never Used  Substance and Sexual Activity  . Alcohol use: Not on file  . Drug use: Not on file  . Sexual activity: Not on file  Lifestyle  . Physical activity    Days per week: Not on file    Minutes per session: Not on file  . Stress: Not on file  Relationships  . Social Herbalist on phone: Not on file    Gets together: Not on file    Attends religious service: Not on file    Active member of club or organization: Not on file    Attends meetings of clubs or organizations: Not on file    Relationship status: Not on file  . Intimate partner violence    Fear of current or ex partner: Not on file    Emotionally abused: Not on file    Physically abused: Not on file    Forced sexual activity: Not on file  Other Topics Concern  . Not on file  Social History Narrative  . Not on file    Outpatient Medications Prior to  Visit  Medication Sig Dispense Refill  . amLODipine (NORVASC) 5 MG tablet TAKE 1 TABLET(5 MG) BY MOUTH DAILY (Patient not taking: Reported on 09/07/2018) 30 tablet 0  . losartan (COZAAR) 50 MG tablet TAKE 1 TABLET(50 MG) BY MOUTH DAILY (Patient not taking: Reported on 09/07/2018) 30 tablet 0  . Simethicone 80 MG TABS Take 1 tablet (80 mg total) by mouth 3 (three) times daily as needed. (Patient not taking: Reported on 09/07/2018) 90 tablet 1   No facility-administered medications prior to visit.     No Known Allergies  ROS Review of Systems  Constitutional: Negative for fatigue.  HENT: Positive for ear pain. Negative for sinus pain.   Respiratory: Negative for cough and shortness of breath.   Cardiovascular: Negative for chest pain and leg swelling.      Objective:    Physical Exam  Constitutional: He appears well-developed and well-nourished. No distress.  HENT:  Head: Normocephalic and atraumatic.  Nose: Nose normal.  Mouth/Throat: No oropharyngeal exudate.  Eyes: Conjunctivae are normal.  Neck: Normal range of motion. Neck supple.  Cardiovascular: Normal rate and regular rhythm.  Pulmonary/Chest:  Effort normal and breath sounds normal.  Musculoskeletal:        General: No tenderness.  bilat cerumen-cleared with eryth canals bilat BP (!) 202/122 (BP Location: Left Arm, Patient Position: Sitting, Cuff Size: Normal)   Pulse 93   Temp 98.1 F (36.7 C) (Oral)   Ht 5' 6"  (1.676 m)   Wt 181 lb (82.1 kg)   SpO2 97%   BMI 29.21 kg/m  Wt Readings from Last 3 Encounters:  09/07/18 181 lb (82.1 kg)  01/24/17 185 lb (83.9 kg)  08/12/16 179 lb (81.2 kg)     Health Maintenance Due  Topic Date Due  . Hepatitis C Screening  06[birth year]  . HIV Screening  01/25/1974  . TETANUS/TDAP  01/25/1978  . COLONOSCOPY  01/25/2009     Lab Results  Component Value Date   WBC 3.2 (A) 07/16/2016   HGB 14.5 07/16/2016   HCT 43.0 (A) 07/16/2016   MCV 86.2 07/16/2016   Lab Results   Component Value Date   NA 146 (H) 07/28/2016   K 3.7 07/28/2016   CO2 25 07/28/2016   GLUCOSE 86 07/28/2016   BUN 17 07/28/2016   CREATININE 1.68 (H) 07/28/2016   BILITOT 0.4 07/28/2016   ALKPHOS 75 07/28/2016   AST 16 07/28/2016   ALT 16 07/28/2016   PROT 6.8 07/28/2016   ALBUMIN 4.1 07/28/2016   CALCIUM 9.4 07/28/2016     Assessment & Plan:   Problem List Items Addressed This Visit    None     1. Essential hypertension - CMP14+EGFR - TSH - amLODipine (NORVASC) 5 MG tablet; Take one po qhs  Dispense: 30 tablet; Refill: 0 - losartan (COZAAR) 50 MG tablet; Take 1 tablet (50 mg total) by mouth daily.  Dispense: 30 tablet; Refill: 0  2. Bilateral impacted cerumen Cleared bilat  F/u 2 weeks Wife to check bp at home and write down     Hannah Beat, MD

## 2018-09-07 NOTE — Patient Instructions (Signed)
Pt to restart blood pressure medication Tonight start amlodipine 5mg  and continue taking-I sent a refill to the pharmacy On Monday morning start taking the losartan and continue to take every morning- I sent your prescription to the pharmacy  I want to see you in the office again in 2 weeks to recheck your blood pressure and discuss your labwork  Have your wife check your blood pressure every morning and write it down

## 2018-09-08 LAB — CMP14+EGFR
ALT: 13 [IU]/L (ref 0–44)
AST: 17 [IU]/L (ref 0–40)
Albumin/Globulin Ratio: 1.7 (ref 1.2–2.2)
Albumin: 4.6 g/dL (ref 3.8–4.9)
Alkaline Phosphatase: 81 [IU]/L (ref 39–117)
BUN/Creatinine Ratio: 13 (ref 9–20)
BUN: 23 mg/dL (ref 6–24)
Bilirubin Total: 0.4 mg/dL (ref 0.0–1.2)
CO2: 19 mmol/L — ABNORMAL LOW (ref 20–29)
Calcium: 9.7 mg/dL (ref 8.7–10.2)
Chloride: 108 mmol/L — ABNORMAL HIGH (ref 96–106)
Creatinine, Ser: 1.8 mg/dL — ABNORMAL HIGH (ref 0.76–1.27)
GFR calc Af Amer: 47 mL/min/{1.73_m2} — ABNORMAL LOW (ref >59)
GFR calc non Af Amer: 40 mL/min/{1.73_m2} — ABNORMAL LOW (ref >59)
Globulin, Total: 2.7 g/dL (ref 1.5–4.5)
Glucose: 98 mg/dL (ref 65–99)
Potassium: 3.3 mmol/L — ABNORMAL LOW (ref 3.5–5.2)
Sodium: 143 mmol/L (ref 134–144)
Total Protein: 7.3 g/dL (ref 6.0–8.5)

## 2018-09-08 LAB — TSH: TSH: 1 u[IU]/mL (ref 0.450–4.500)

## 2018-09-12 ENCOUNTER — Other Ambulatory Visit: Payer: Self-pay | Admitting: Family Medicine

## 2018-09-12 DIAGNOSIS — N289 Disorder of kidney and ureter, unspecified: Secondary | ICD-10-CM

## 2018-09-12 DIAGNOSIS — I10 Essential (primary) hypertension: Secondary | ICD-10-CM

## 2018-09-21 ENCOUNTER — Encounter: Payer: Self-pay | Admitting: Family Medicine

## 2018-09-21 ENCOUNTER — Other Ambulatory Visit: Payer: Self-pay

## 2018-09-21 ENCOUNTER — Ambulatory Visit: Payer: Self-pay | Admitting: Family Medicine

## 2018-09-21 VITALS — BP 140/88 | HR 84 | Temp 98.2°F | Resp 16 | Ht 66.0 in | Wt 185.0 lb

## 2018-09-21 DIAGNOSIS — I10 Essential (primary) hypertension: Secondary | ICD-10-CM

## 2018-09-21 DIAGNOSIS — R9431 Abnormal electrocardiogram [ECG] [EKG]: Secondary | ICD-10-CM | POA: Insufficient documentation

## 2018-09-21 DIAGNOSIS — N289 Disorder of kidney and ureter, unspecified: Secondary | ICD-10-CM | POA: Insufficient documentation

## 2018-09-21 LAB — LIPID PANEL

## 2018-09-21 NOTE — Patient Instructions (Addendum)
If you have lab work done today you will be contacted with your lab results within the next 2 weeks.  If you have not heard from us then please contact us. The fastest way to get your results is to register for My Chart.  REFERRAL DEPT WILL CALL YOU ABOUT YOUR CARDIOLOGY APPT YOUR ECG WAS ABNORMAL IF you received an x-ray today, you will receive an invoice from [facility]. Please contact [facility] at [phone] with questions or concerns regarding your invoice.   IF you received labwork today, you will receive an invoice from [facility]. Please contact [facility] at [phone] with questions or concerns regarding your invoice.   Our billing staff will not be able to assist you with questions regarding bills from these companies.  You will be contacted with the lab results as soon as they are available. The fastest way to get your results is to activate your My Chart account. Instructions are located on the last page of this paperwork. If you have not heard from us regarding the results in 2 weeks, please contact this office.      Managing Your Hypertension Hypertension is commonly called high blood pressure. This is when the force of your blood pressing against the walls of your arteries is too strong. Arteries are blood vessels that carry blood from your heart throughout your body. Hypertension forces the heart to work harder to pump blood, and may cause the arteries to become narrow or stiff. Having untreated or uncontrolled hypertension can cause heart attack, stroke, kidney disease, and other problems. What are blood pressure readings? A blood pressure reading consists of a higher number over a lower number. Ideally, your blood pressure should be below 120/80. The first ("top") number is called the systolic pressure. It is a measure of the pressure in your arteries as your heart beats. The second ("bottom") number is called the diastolic pressure. It is a measure of  the pressure in your arteries as the heart relaxes. What does my blood pressure reading mean? Blood pressure is classified into four stages. Based on your blood pressure reading, your health care provider may use the following stages to determine what type of treatment you need, if any. Systolic pressure and diastolic pressure are measured in a unit called mm Hg. Normal  Systolic pressure: below 120.  Diastolic pressure: below 80. Elevated  Systolic pressure: 120-129.  Diastolic pressure: below 80. Hypertension stage 1  Systolic pressure: 130-139.  Diastolic pressure: 80-89. Hypertension stage 2  Systolic pressure: 140 or above.  Diastolic pressure: 90 or above. What health risks are associated with hypertension? Managing your hypertension is an important responsibility. Uncontrolled hypertension can lead to:  A heart attack.  A stroke.  A weakened blood vessel (aneurysm).  Heart failure.  Kidney damage.  Eye damage.  Metabolic syndrome.  Memory and concentration problems. What changes can I make to manage my hypertension? Hypertension can be managed by making lifestyle changes and possibly by taking medicines. Your health care provider will help you make a plan to bring your blood pressure within a normal range. Eating and drinking   Eat a diet that is high in fiber and potassium, and low in salt (sodium), added sugar, and fat. An example eating plan is called the DASH (Dietary Approaches to Stop Hypertension) diet. To eat this way: ? Eat plenty of fresh fruits and vegetables. Try to fill half of your plate at each meal with fruits and vegetables. ? Eat whole  grains, such as whole wheat pasta, brown rice, or whole grain bread. Fill about one quarter of your plate with whole grains. ? Eat low-fat diary products. ? Avoid fatty cuts of meat, processed or cured meats, and poultry with skin. Fill about one quarter of your plate with lean proteins such as fish, chicken  without skin, beans, eggs, and tofu. ? Avoid premade and processed foods. These tend to be higher in sodium, added sugar, and fat.  Reduce your daily sodium intake. Most people with hypertension should eat less than 1,500 mg of sodium a day.  Limit alcohol intake to no more than 1 drink a day for nonpregnant women and 2 drinks a day for men. One drink equals 12 oz of beer, 5 oz of wine, or 1 oz of hard liquor. Lifestyle  Work with your health care provider to maintain a healthy body weight, or to lose weight. Ask what an ideal weight is for you.  Get at least 30 minutes of exercise that causes your heart to beat faster (aerobic exercise) most days of the week. Activities may include walking, swimming, or biking.  Include exercise to strengthen your muscles (resistance exercise), such as weight lifting, as part of your weekly exercise routine. Try to do these types of exercises for 30 minutes at least 3 days a week.  Do not use any products that contain nicotine or tobacco, such as cigarettes and e-cigarettes. If you need help quitting, ask your health care provider.  Control any long-term (chronic) conditions you have, such as high cholesterol or diabetes. Monitoring  Monitor your blood pressure at home as told by your health care provider. Your personal target blood pressure may vary depending on your medical conditions, your age, and other factors.  Have your blood pressure checked regularly, as often as told by your health care provider. Working with your health care provider  Review all the medicines you take with your health care provider because there may be side effects or interactions.  Talk with your health care provider about your diet, exercise habits, and other lifestyle factors that may be contributing to hypertension.  Visit your health care provider regularly. Your health care provider can help you create and adjust your plan for managing hypertension. Will I need  medicine to control my blood pressure? Your health care provider may prescribe medicine if lifestyle changes are not enough to get your blood pressure under control, and if:  Your systolic blood pressure is 130 or higher.  Your diastolic blood pressure is 80 or higher. Take medicines only as told by your health care provider. Follow the directions carefully. Blood pressure medicines must be taken as prescribed. The medicine does not work as well when you skip doses. Skipping doses also puts you at risk for problems. Contact a health care provider if:  You think you are having a reaction to medicines you have taken.  You have repeated (recurrent) headaches.  You feel dizzy.  You have swelling in your ankles.  You have trouble with your vision. Get help right away if:  You develop a severe headache or confusion.  You have unusual weakness or numbness, or you feel faint.  You have severe pain in your chest or abdomen.  You vomit repeatedly.  You have trouble breathing. Summary  Hypertension is when the force of blood pumping through your arteries is too strong. If this condition is not controlled, it may put you at risk for serious complications.  Your personal target  blood pressure may vary depending on your medical conditions, your age, and other factors. For most people, a normal blood pressure is less than 120/80.  Hypertension is managed by lifestyle changes, medicines, or both. Lifestyle changes include weight loss, eating a healthy, low-sodium diet, exercising more, and limiting alcohol. This information is not intended to replace advice given to you by your health care provider. Make sure you discuss any questions you have with your health care provider. Document Released: 12/08/2011 Document Revised: 02/11/2016 Document Reviewed: 02/11/2016 Elsevier Interactive Patient Education  2019 [employer].

## 2018-09-21 NOTE — Progress Notes (Signed)
Acute Office Visit  Subjective:    Patient ID: Todd Maynard, male    DOB: Oct 07, 1960, 58 y.o.   MRN: [id number]  Chief Complaint  Patient presents with  . Hypertension    two week follow-up for HTN, pt states he feels better and his blood pressure is going down since starting back on medication.     HPI Patient is in today for  Hypertension-taking amlodipine and losartan daily. No headaches. No visual changes. No h/o of CP/SOB. Pt with +FH CAD. Pt with no h/o cardiac testing  Past Medical History:  Diagnosis Date  . Hypertension   . Neuromuscular disorder [facility])     Social History   Socioeconomic History  . Marital status: Married    Spouse name: Not on file  . Number of children: Not on file  . Years of education: Not on file  . Highest education level: Not on file  Occupational History  . Not on file  Social Needs  . Financial resource strain: Not on file  . Food insecurity    Worry: Not on file    Inability: Not on file  . Transportation needs    Medical: Not on file    Non-medical: Not on file  Tobacco Use  . Smoking status: Never Smoker  . Smokeless tobacco: Never Used  Substance and Sexual Activity  . Alcohol use: Not on file  . Drug use: Not on file  . Sexual activity: Not on file  Lifestyle  . Physical activity    Days per week: Not on file    Minutes per session: Not on file  . Stress: Not on file  Relationships  . Social Herbalist on phone: Not on file    Gets together: Not on file    Attends religious service: Not on file    Active member of club or organization: Not on file    Attends meetings of clubs or organizations: Not on file    Relationship status: Not on file  . Intimate partner violence    Fear of current or ex partner: Not on file    Emotionally abused: Not on file    Physically abused: Not on file    Forced sexual activity: Not on file  Other Topics Concern  . Not on file  Social History Narrative  . Not on file     Outpatient Medications Prior to Visit  Medication Sig Dispense Refill  . amLODipine (NORVASC) 5 MG tablet Take one po qhs 30 tablet 0  . losartan (COZAAR) 50 MG tablet Take 1 tablet (50 mg total) by mouth daily. 30 tablet 0  . Simethicone 80 MG TABS Take 1 tablet (80 mg total) by mouth 3 (three) times daily as needed. 90 tablet 1   No facility-administered medications prior to visit.     No Known Allergies  Review of Systems  Constitutional: Negative for fever.  Respiratory: Negative for cough.   Cardiovascular: Negative for chest pain and palpitations.  Genitourinary: Negative for urgency.  Neurological: Negative for dizziness and headaches.       Objective:    Physical Exam  Constitutional: He appears well-developed and well-nourished.  HENT:  Head: Normocephalic and atraumatic.  Eyes: Conjunctivae are normal.  Neck: Normal range of motion. Neck supple.  Cardiovascular: Normal rate, regular rhythm and normal heart sounds.  Pulmonary/Chest: Effort normal and breath sounds normal.  Neurological: He is alert.    BP 140/88   Pulse 84  Temp 98.2 F (36.8 C) (Oral)   Resp 16   Ht 5\' 6"  (1.676 m)   Wt 185 lb (83.9 kg)   SpO2 99%   BMI 29.86 kg/m  Wt Readings from Last 3 Encounters:  09/21/18 185 lb (83.9 kg)  09/07/18 181 lb (82.1 kg)  01/24/17 185 lb (83.9 kg)    Health Maintenance Due  Topic Date Due  . Hepatitis C Screening  July 09, 1960  . HIV Screening  01/25/1974  . TETANUS/TDAP  01/25/1978  . COLONOSCOPY  01/25/2009      Lab Results  Component Value Date   TSH 1.000 09/07/2018   Lab Results  Component Value Date   WBC 3.2 (A) 07/16/2016   HGB 14.5 07/16/2016   HCT 43.0 (A) 07/16/2016   MCV 86.2 07/16/2016   Lab Results  Component Value Date   NA 143 09/07/2018   K 3.3 (L) 09/07/2018   CO2 19 (L) 09/07/2018   GLUCOSE 98 09/07/2018   BUN 23 09/07/2018   CREATININE 1.80 (H) 09/07/2018   BILITOT 0.4 09/07/2018   ALKPHOS 81 09/07/2018    AST 17 09/07/2018   ALT 13 09/07/2018   PROT 7.3 09/07/2018   ALBUMIN 4.6 09/07/2018   CALCIUM 9.7 09/07/2018     Assessment & Plan:   1. Essential hypertension Continue amlodipine and losartan daily Concern for ecg changes-cardio referral - Lipid Panel - Basic Metabolic Panel - EKG 12-Lead  2. ECG abnormal Cardio referral  3. Abnormal renal function Recheck bmp-u/a  Mat CarneLEIGH , MD

## 2018-09-22 LAB — LIPID PANEL
Chol/HDL Ratio: 3.7 {ratio} (ref 0.0–5.0)
Cholesterol, Total: 165 mg/dL (ref 100–199)
HDL: 45 mg/dL (ref >39)
LDL Calculated: 102 mg/dL — ABNORMAL HIGH (ref 0–99)
Triglycerides: 91 mg/dL (ref 0–149)
VLDL Cholesterol Cal: 18 mg/dL (ref 5–40)

## 2018-09-22 LAB — BASIC METABOLIC PANEL
BUN/Creatinine Ratio: 11 (ref 9–20)
BUN: 18 mg/dL (ref 6–24)
CO2: 19 mmol/L — ABNORMAL LOW (ref 20–29)
Calcium: 9.6 mg/dL (ref 8.7–10.2)
Chloride: 108 mmol/L — ABNORMAL HIGH (ref 96–106)
Creatinine, Ser: 1.7 mg/dL — ABNORMAL HIGH (ref 0.76–1.27)
GFR calc Af Amer: 50 mL/min/{1.73_m2} — ABNORMAL LOW (ref >59)
GFR calc non Af Amer: 43 mL/min/{1.73_m2} — ABNORMAL LOW (ref >59)
Glucose: 103 mg/dL — ABNORMAL HIGH (ref 65–99)
Potassium: 3.5 mmol/L (ref 3.5–5.2)
Sodium: 144 mmol/L (ref 134–144)

## 2018-10-09 ENCOUNTER — Other Ambulatory Visit: Payer: Self-pay | Admitting: *Deleted

## 2018-10-09 DIAGNOSIS — I10 Essential (primary) hypertension: Secondary | ICD-10-CM

## 2018-10-09 MED ORDER — LOSARTAN POTASSIUM 50 MG PO TABS: 50.0000 mg | ORAL_TABLET | Freq: Every day | ORAL | 0 refills | Status: DC

## 2018-10-09 MED ORDER — AMLODIPINE BESYLATE 5 MG PO TABS: ORAL_TABLET | ORAL | 0 refills | Status: DC

## 2018-11-10 ENCOUNTER — Encounter: Payer: Self-pay | Admitting: *Deleted

## 2018-12-20 ENCOUNTER — Other Ambulatory Visit: Payer: Self-pay

## 2018-12-20 ENCOUNTER — Encounter: Payer: Self-pay | Admitting: Registered Nurse

## 2018-12-20 ENCOUNTER — Ambulatory Visit (INDEPENDENT_AMBULATORY_CARE_PROVIDER_SITE_OTHER): Payer: Self-pay | Admitting: Registered Nurse

## 2018-12-20 VITALS — BP 130/88 | HR 65 | Temp 98.3°F | Resp 16 | Ht 66.0 in | Wt 182.0 lb

## 2018-12-20 DIAGNOSIS — Z23 Encounter for immunization: Secondary | ICD-10-CM

## 2018-12-20 DIAGNOSIS — N289 Disorder of kidney and ureter, unspecified: Secondary | ICD-10-CM

## 2018-12-20 DIAGNOSIS — I10 Essential (primary) hypertension: Secondary | ICD-10-CM

## 2018-12-20 MED ORDER — LOSARTAN POTASSIUM 50 MG PO TABS: 50.0000 mg | ORAL_TABLET | Freq: Every day | ORAL | 1 refills | Status: DC

## 2018-12-20 MED ORDER — AMLODIPINE BESYLATE 5 MG PO TABS: ORAL_TABLET | ORAL | 1 refills | Status: DC

## 2018-12-20 NOTE — Progress Notes (Signed)
Established Patient Office Visit  Subjective:  Patient ID: Todd Maynard, male    DOB: 1960/12/03  Age: 58 y.o. MRN: [id number]  CC:  Chief Complaint  Patient presents with  . Hypertension    3 month follow-up   . Medication Refill    HPI [name] presents for HTN check  He is taking amlodipine 5mg  PO qd and Losartan 50mg  PO qd. This has provided good effect. Denies symptoms of HTN including chest pain, headaches, shob, dependent edema, palpitations, visual changes. States that he had taken Dr. Lance Coon advice of improving diet, mild exercise, resting plenty, and avoiding staying out in the sun for too long.  Has not been seen by Cardiology or Nephrology. We discussed the risks of avoiding specialty care.  No other complaints today. We will perform follow up CMP.  Past Medical History:  Diagnosis Date  . Hypertension   . Neuromuscular disorder ([facility])     History reviewed. No pertinent surgical history.  History reviewed. No pertinent family history.  Social History   Socioeconomic History  . Marital status: Married    Spouse name: Not on file  . Number of children: Not on file  . Years of education: Not on file  . Highest education level: Not on file  Occupational History  . Not on file  Social Needs  . Financial resource strain: Not on file  . Food insecurity    Worry: Not on file    Inability: Not on file  . Transportation needs    Medical: Not on file    Non-medical: Not on file  Tobacco Use  . Smoking status: Never Smoker  . Smokeless tobacco: Never Used  Substance and Sexual Activity  . Alcohol use: Not on file  . Drug use: Not on file  . Sexual activity: Not on file  Lifestyle  . Physical activity    Days per week: Not on file    Minutes per session: Not on file  . Stress: Not on file  Relationships  . Social Musician on phone: Not on file    Gets together: Not on file    Attends religious service: Not on file    Active  member of club or organization: Not on file    Attends meetings of clubs or organizations: Not on file    Relationship status: Not on file  . Intimate partner violence    Fear of current or ex partner: Not on file    Emotionally abused: Not on file    Physically abused: Not on file    Forced sexual activity: Not on file  Other Topics Concern  . Not on file  Social History Narrative  . Not on file    Outpatient Medications Prior to Visit  Medication Sig Dispense Refill  . Simethicone 80 MG TABS Take 1 tablet (80 mg total) by mouth 3 (three) times daily as needed. 90 tablet 1  . amLODipine (NORVASC) 5 MG tablet Take one po qhs 60 tablet 0  . losartan (COZAAR) 50 MG tablet Take 1 tablet (50 mg total) by mouth daily. 60 tablet 0   No facility-administered medications prior to visit.     No Known Allergies  ROS Review of Systems  Constitutional: Negative.   HENT: Negative.   Eyes: Negative.   Respiratory: Negative.   Cardiovascular: Negative.   Gastrointestinal: Negative.   Endocrine: Negative.   Genitourinary: Negative.   Musculoskeletal: Negative.   Skin: Negative.  Allergic/Immunologic: Negative.   Neurological: Negative.   Hematological: Negative.   Psychiatric/Behavioral: Negative.   All other systems reviewed and are negative.     Objective:    Physical Exam  Constitutional: He is oriented to person, place, and time. He appears well-developed and well-nourished. No distress.  Cardiovascular: Normal rate and regular rhythm.  Pulmonary/Chest: Effort normal. No respiratory distress.  Neurological: He is alert and oriented to person, place, and time.  Skin: Skin is warm and dry. No rash noted. He is not diaphoretic. No erythema. No pallor.  Psychiatric: He has a normal mood and affect. His behavior is normal. Judgment and thought content normal.  Nursing note and vitals reviewed.   BP 130/88   Pulse 65   Temp 98.3 F (36.8 C) (Oral)   Resp 16   Ht 5\' 6"   (1.676 m)   Wt 182 lb (82.6 kg)   SpO2 98%   BMI 29.38 kg/m  Wt Readings from Last 3 Encounters:  12/20/18 182 lb (82.6 kg)  09/21/18 185 lb (83.9 kg)  09/07/18 181 lb (82.1 kg)     Health Maintenance Due  Topic Date Due  . Hepatitis C Screening  [birth year]  . HIV Screening  01/25/1974  . TETANUS/TDAP  01/25/1978  . COLONOSCOPY  01/25/2009    There are no preventive care reminders to display for this patient.  Lab Results  Component Value Date   TSH 1.000 09/07/2018   Lab Results  Component Value Date   WBC 3.2 (A) 07/16/2016   HGB 14.5 07/16/2016   HCT 43.0 (A) 07/16/2016   MCV 86.2 07/16/2016   Lab Results  Component Value Date   NA 144 09/21/2018   K 3.5 09/21/2018   CO2 19 (L) 09/21/2018   GLUCOSE 103 (H) 09/21/2018   BUN 18 09/21/2018   CREATININE 1.70 (H) 09/21/2018   BILITOT 0.4 09/07/2018   ALKPHOS 81 09/07/2018   AST 17 09/07/2018   ALT 13 09/07/2018   PROT 7.3 09/07/2018   ALBUMIN 4.6 09/07/2018   CALCIUM 9.6 09/21/2018   Lab Results  Component Value Date   CHOL 165 09/21/2018   Lab Results  Component Value Date   HDL 45 09/21/2018   Lab Results  Component Value Date   LDLCALC 102 (H) 09/21/2018   Lab Results  Component Value Date   TRIG 91 09/21/2018   Lab Results  Component Value Date   CHOLHDL 3.7 09/21/2018   No results found for: HGBA1C    Assessment & Plan:   Problem List Items Addressed This Visit      Cardiovascular and Mediastinum   Essential hypertension   Relevant Medications   amLODipine (NORVASC) 5 MG tablet   losartan (COZAAR) 50 MG tablet     Other   Abnormal renal function   Relevant Orders   Basic Metabolic Panel    Other Visit Diagnoses    Need for influenza vaccination    -  Primary   Relevant Orders   Flu Vaccine QUAD 36+ mos IM      Meds ordered this encounter  Medications  . amLODipine (NORVASC) 5 MG tablet    Sig: Take one po qhs    Dispense:  90 tablet    Refill:  1    Order Specific  Question:   Supervising Provider    Answer:   Delia Chimes A [id number]  . losartan (COZAAR) 50 MG tablet    Sig: Take 1 tablet (50 mg total) by mouth daily.  Dispense:  90 tablet    Refill:  1    Order Specific Question:   Supervising Provider    Answer:   Doristine Bosworth [id number]    Follow-up: Return in about 6 months (around 06/19/2019) for bp check med check.   PLAN  BMP ordered. Will follow up as warranted  Blood pressure in control. Six month follow up if BMP steady/improved  Patient encouraged to call clinic with any questions, comments, or concerns.   Janeece Agee, NP

## 2018-12-20 NOTE — Patient Instructions (Signed)
° ° ° °  If you have lab work done today you will be contacted with your lab results within the next 2 weeks.  If you have not heard from us then please contact us. The fastest way to get your results is to register for My Chart. ° ° °IF you received an x-ray today, you will receive an invoice from [facility]. Please contact [facility] at [phone] with questions or concerns regarding your invoice.  ° °IF you received labwork today, you will receive an invoice from [facility]. Please contact [facility] at [phone] with questions or concerns regarding your invoice.  ° °Our billing staff will not be able to assist you with questions regarding bills from these companies. ° °You will be contacted with the lab results as soon as they are available. The fastest way to get your results is to activate your My Chart account. Instructions are located on the last page of this paperwork. If you have not heard from us regarding the results in 2 weeks, please contact this office. °  ° ° ° °

## 2018-12-21 LAB — BASIC METABOLIC PANEL
BUN/Creatinine Ratio: 13 (ref 9–20)
BUN: 23 mg/dL (ref 6–24)
CO2: 20 mmol/L (ref 20–29)
Calcium: 9.9 mg/dL (ref 8.7–10.2)
Chloride: 104 mmol/L (ref 96–106)
Creatinine, Ser: 1.8 mg/dL — ABNORMAL HIGH (ref 0.76–1.27)
GFR calc Af Amer: 47 mL/min/{1.73_m2} — ABNORMAL LOW (ref >59)
GFR calc non Af Amer: 40 mL/min/{1.73_m2} — ABNORMAL LOW (ref >59)
Glucose: 95 mg/dL (ref 65–99)
Potassium: 3.5 mmol/L (ref 3.5–5.2)
Sodium: 141 mmol/L (ref 134–144)

## 2019-03-05 ENCOUNTER — Telehealth: Payer: Self-pay

## 2019-03-05 NOTE — Telephone Encounter (Signed)
Pt.'s brother called on his behalf wanting to inform the referral team that his brother's insurance had lapsed and they were working on getting new insurance

## 2019-06-19 ENCOUNTER — Ambulatory Visit: Payer: Self-pay | Admitting: Registered Nurse

## 2019-06-20 ENCOUNTER — Encounter: Payer: Self-pay | Admitting: Registered Nurse

## 2019-09-13 ENCOUNTER — Other Ambulatory Visit: Payer: Self-pay

## 2019-09-13 DIAGNOSIS — I10 Essential (primary) hypertension: Secondary | ICD-10-CM

## 2019-09-13 MED ORDER — AMLODIPINE BESYLATE 5 MG PO TABS: ORAL_TABLET | ORAL | 0 refills | Status: DC

## 2019-09-13 MED ORDER — LOSARTAN POTASSIUM 50 MG PO TABS: 50.0000 mg | ORAL_TABLET | Freq: Every day | ORAL | 0 refills | Status: DC

## 2019-09-20 ENCOUNTER — Other Ambulatory Visit: Payer: Self-pay

## 2019-09-20 ENCOUNTER — Ambulatory Visit (INDEPENDENT_AMBULATORY_CARE_PROVIDER_SITE_OTHER): Payer: Self-pay | Admitting: Family Medicine

## 2019-09-20 ENCOUNTER — Telehealth: Payer: Self-pay

## 2019-09-20 VITALS — BP 148/100 | HR 83 | Temp 97.9°F | Resp 14 | Ht 66.0 in | Wt 175.6 lb

## 2019-09-20 DIAGNOSIS — I10 Essential (primary) hypertension: Secondary | ICD-10-CM

## 2019-09-20 DIAGNOSIS — E049 Nontoxic goiter, unspecified: Secondary | ICD-10-CM

## 2019-09-20 MED ORDER — LOSARTAN POTASSIUM 50 MG PO TABS: 50.0000 mg | ORAL_TABLET | Freq: Every day | ORAL | 0 refills | Status: DC

## 2019-09-20 MED ORDER — AMLODIPINE BESYLATE 5 MG PO TABS: ORAL_TABLET | ORAL | 0 refills | Status: DC

## 2019-09-20 NOTE — Progress Notes (Signed)
Patient ID: Todd Maynard, male    DOB: November 08, 1960  Age: 59 y.o. MRN: [id number]  Chief Complaint  Patient presents with  . Hypertension    pt needs refills of his medication, pt denies physical symptoms, pt denies side effects     Subjective:   Patient is here for his blood pressure check.  He has no major complaints.  He did not seem to know the answers to some things, so I spoke to his brother who is out in the parking lot on the brother's phone.  The patient has had no complaints.  He has been out of his blood pressure medicine probably for several months.  The patient works regularly in a restaurant washing dishes.  He wears gloves when he washes the dishes he tells me.  He is married.  Denies any headaches or dizziness or chest pains.   Current allergies, medications, problem list, past/family and social histories reviewed.  Objective:  BP (!) 148/100   Pulse 83   Temp 97.9 F (36.6 C) (Temporal)   Resp 14   Ht 5\' 6"  (1.676 m)   Wt 175 lb 9.6 oz (79.7 kg)   SpO2 100%   BMI 28.34 kg/m  Pleasant gentleman in no major distress.  Neck supple.  Possible enlargement of the left thyroid gland.  We need to check that out.  Chest clear.  Heart rate without murmur.  Assessment & Plan:   Assessment: 1. Essential hypertension   2. Thyroid enlarged       Plan: See instructions  Call his brother with the instructions about the ultrasound [phone]  Orders Placed This Encounter  Procedures  . [phone] THYROID    Asymptomatic possible left thyroid nodule on routine exam    Standing Status:   Future    Standing Expiration Date:   09/19/2020    Order Specific Question:   Reason for Exam (SYMPTOM  OR DIAGNOSIS REQUIRED)    Answer:   Possible left thyroid enlargement    Order Specific Question:   Preferred imaging location?    Answer:   [id number] 10-11-1970  . Comprehensive metabolic panel  . TSH    Meds ordered this encounter  Medications  . amLODipine (NORVASC) 5 MG  tablet    Sig: Take one po qhs    Dispense:  90 tablet    Refill:  0  . losartan (COZAAR) 50 MG tablet    Sig: Take 1 tablet (50 mg total) by mouth daily.    Dispense:  90 tablet    Refill:  0         Patient Instructions    Your blood pressure was significantly elevated when you here today.  When you came in it was 180/117, but gradually came down to 148/100.  It is very important that you take your medicines faithfully, and do not allow it to run out.  If you are running low get rechecked.  Amlodipine 5 mg 1 daily  Losartan 50 mg 1 daily  Return in about 2 weeks to have a recheck of your blood pressure  On examination I feel like you have a little enlargement of the left side of side of your thyroid gland and we need to make sure you do not have a nodule developing there.  I have ordered some blood work on you today, and have also ordered a ultrasound of your thyroid gland.  This will be scheduled and someone will contact you as to when you  should go get the scan of your thyroid done.  If you do not hear from the scheduling of this within the next week call back and speak to the referrals desk here at this office.  If you have lab work done today you will be contacted with your lab results within the next 2 weeks.  If you have not heard from [place] then please contact us. The fastest way to get your results is to register for My Chart.   IF you received an x-ray today, you will receive an invoice from [facility]. Please contact [facility] at [phone] with questions or concerns regarding your invoice.   IF you received labwork today, you will receive an invoice from [facility]. Please contact [facility] at [phone] with questions or concerns regarding your invoice.   Our billing staff will not be able to assist you with questions regarding bills from these companies.  You will be contacted with the lab results as soon as they are available. The fastest way to  get your results is to activate your My Chart account. Instructions are located on the last page of this paperwork. If you have not heard from [place] regarding the results in 2 weeks, please contact this office.        Return in about 2 weeks (around 10/04/2019) for blood pressure recheck.   Ruben Reason, MD 09/20/2019

## 2019-09-20 NOTE — Telephone Encounter (Signed)
Dr. Alwyn Ren ordered Thyroid US  Please put in that pt brother should be contacted to schedule as pt has memory loss issues. Brothers name is Tasia Catchings and phone number is [phone]  Thank you

## 2019-09-20 NOTE — Patient Instructions (Addendum)
°  Your blood pressure was significantly elevated when you here today.  When you came in it was 180/117, but gradually came down to 148/100.  It is very important that you take your medicines faithfully, and do not allow it to run out.  If you are running low get rechecked.  Amlodipine 5 mg 1 daily  Losartan 50 mg 1 daily  Return in about 2 weeks to have a recheck of your blood pressure  On examination I feel like you have a little enlargement of the left side of side of your thyroid gland and we need to make sure you do not have a nodule developing there.  I have ordered some blood work on you today, and have also ordered a ultrasound of your thyroid gland.  This will be scheduled and someone will contact you as to when you should go get the scan of your thyroid done.  If you do not hear from the scheduling of this within the next week call back and speak to the referrals desk here at this office.  If you have lab work done today you will be contacted with your lab results within the next 2 weeks.  If you have not heard from [place] then please contact us. The fastest way to get your results is to register for My Chart.   IF you received an x-ray today, you will receive an invoice from [facility]. Please contact [facility] at [phone] with questions or concerns regarding your invoice.   IF you received labwork today, you will receive an invoice from [facility]. Please contact [facility] at [phone] with questions or concerns regarding your invoice.   Our billing staff will not be able to assist you with questions regarding bills from these companies.  You will be contacted with the lab results as soon as they are available. The fastest way to get your results is to activate your My Chart account. Instructions are located on the last page of this paperwork. If you have not heard from [place] regarding the results in 2 weeks, please contact this office.

## 2019-09-21 LAB — COMPREHENSIVE METABOLIC PANEL
ALT: 14 [IU]/L (ref 0–44)
AST: 19 [IU]/L (ref 0–40)
Albumin/Globulin Ratio: 1.7 (ref 1.2–2.2)
Albumin: 4.6 g/dL (ref 3.8–4.9)
Alkaline Phosphatase: 93 [IU]/L (ref 48–121)
BUN/Creatinine Ratio: 11 (ref 10–24)
BUN: 20 mg/dL (ref 8–27)
Bilirubin Total: 0.4 mg/dL (ref 0.0–1.2)
CO2: 23 mmol/L (ref 20–29)
Calcium: 10 mg/dL (ref 8.6–10.2)
Chloride: 106 mmol/L (ref 96–106)
Creatinine, Ser: 1.8 mg/dL — ABNORMAL HIGH (ref 0.76–1.27)
GFR calc Af Amer: 46 mL/min/{1.73_m2} — ABNORMAL LOW (ref >59)
GFR calc non Af Amer: 40 mL/min/{1.73_m2} — ABNORMAL LOW (ref >59)
Globulin, Total: 2.7 g/dL (ref 1.5–4.5)
Glucose: 93 mg/dL (ref 65–99)
Potassium: 3.8 mmol/L (ref 3.5–5.2)
Sodium: 142 mmol/L (ref 134–144)
Total Protein: 7.3 g/dL (ref 6.0–8.5)

## 2019-09-21 LAB — TSH: TSH: 1.58 u[IU]/mL (ref 0.450–4.500)

## 2019-09-21 NOTE — Progress Notes (Signed)
I called and informed the patient of the results.

## 2019-10-02 ENCOUNTER — Ambulatory Visit
Admission: RE | Admit: 2019-10-02 | Discharge: 2019-10-02 | Disposition: A | Discharge status: Home / Self Care | Payer: No Typology Code available for payment source | Source: Ambulatory Visit | Attending: Family Medicine | Admitting: Family Medicine

## 2019-10-02 DIAGNOSIS — E049 Nontoxic goiter, unspecified: Secondary | ICD-10-CM

## 2019-10-04 ENCOUNTER — Other Ambulatory Visit: Payer: Self-pay | Admitting: Family Medicine

## 2019-10-04 DIAGNOSIS — E049 Nontoxic goiter, unspecified: Secondary | ICD-10-CM

## 2019-10-04 NOTE — Progress Notes (Signed)
I spoke to the patient regarding his thyroid scan which meets criteria to go ahead and get a percutaneous biopsy done.  Referral will be made.  Sandria Bales. Alwyn Ren, MD

## 2019-10-11 ENCOUNTER — Other Ambulatory Visit: Payer: Self-pay

## 2019-10-11 ENCOUNTER — Ambulatory Visit (INDEPENDENT_AMBULATORY_CARE_PROVIDER_SITE_OTHER): Payer: Self-pay | Admitting: Family Medicine

## 2019-10-11 DIAGNOSIS — I10 Essential (primary) hypertension: Secondary | ICD-10-CM

## 2019-10-11 MED ORDER — LOSARTAN POTASSIUM 100 MG PO TABS: 100.0000 mg | ORAL_TABLET | Freq: Every day | ORAL | 1 refills | Status: DC

## 2019-10-11 NOTE — Patient Instructions (Addendum)
  Take losartan 100 mg each morning.  What you have is the 50 mg pills and you can double up on them until they are gone and then switch over to the 100 mg pills that I am prescribing today.  Take the amlodipine 5 mg one every evening at bedtime  Plan to return in about 2 months for recheck on the blood pressure.   If you have lab work done today you will be contacted with your lab results within the next 2 weeks.  If you have not heard from [place] then please contact us. The fastest way to get your results is to register for My Chart.   IF you received an x-ray today, you will receive an invoice from [facility]. Please contact [facility] at [phone] with questions or concerns regarding your invoice.   IF you received labwork today, you will receive an invoice from [facility]. Please contact [facility] at [phone] with questions or concerns regarding your invoice.   Our billing staff will not be able to assist you with questions regarding bills from these companies.  You will be contacted with the lab results as soon as they are available. The fastest way to get your results is to activate your My Chart account. Instructions are located on the last page of this paperwork. If you have not heard from [place] regarding the results in 2 weeks, please contact this office.

## 2019-10-11 NOTE — Progress Notes (Signed)
Patient ID: Gregorio Worley, male    DOB: [birth year]  Age: 59 y.o. MRN: [id number]  Chief Complaint  Patient presents with  . Follow-up    BP    Subjective:   59 year old man seen recently for his high blood pressure.  It is coming down a little bit, but still elevated.  He takes both pills every morning.  He gets some exercise by riding a bicycle.  No major complaints.  Current allergies, medications, problem list, past/family and social histories reviewed.  Objective:  BP (!) 148/94 (BP Location: Right Arm, Patient Position: Sitting, Cuff Size: Normal)   Pulse 75   Temp 97.9 F (36.6 C) (Temporal)   Ht 5' 8.5" (1.74 m)   Wt 174 lb 3.2 oz (79 kg)   SpO2 94%   BMI 26.10 kg/m   Blood pressure numbers are noted.  I repeated the blood pressure and got 138/92.   Assessment & Plan:   Assessment: 1. Essential hypertension       Plan: We will increase the dose of his medicines and have him spread when he takes them.  No orders of the defined types were placed in this encounter.   Meds ordered this encounter  Medications  . losartan (COZAAR) 100 MG tablet    Sig: Take 1 tablet (100 mg total) by mouth daily.    Dispense:  90 tablet    Refill:  1    Please remind patient that the losartan he has at home is 50 mg.  He can double up on them until they are gone and then beginning on the 100 mg pills daily.         Patient Instructions    Take losartan 100 mg each morning.  What you have is the 50 mg pills and you can double up on them until they are gone and then switch over to the 100 mg pills that I am prescribing today.  Take the amlodipine 5 mg one every evening at bedtime  Plan to return in about 2 months for recheck on the blood pressure.   If you have lab work done today you will be contacted with your lab results within the next 2 weeks.  If you have not heard from [place] then please contact us. The fastest way to get your results is to register for My  Chart.   IF you received an x-ray today, you will receive an invoice from [facility]. Please contact [facility] at [phone] with questions or concerns regarding your invoice.   IF you received labwork today, you will receive an invoice from [facility]. Please contact [facility] at [phone] with questions or concerns regarding your invoice.   Our billing staff will not be able to assist you with questions regarding bills from these companies.  You will be contacted with the lab results as soon as they are available. The fastest way to get your results is to activate your My Chart account. Instructions are located on the last page of this paperwork. If you have not heard from [place] regarding the results in 2 weeks, please contact this office.        Return in about 2 months (around 12/12/2019) for Blood pressure recheck.   Janace Hoard, MD 10/11/2019

## 2019-11-21 ENCOUNTER — Telehealth: Payer: Self-pay | Admitting: Family Medicine

## 2019-11-21 NOTE — Telephone Encounter (Signed)
Pts brother called and is wanting a call regarding his referral that was put on 10/04/19. Please advise.

## 2019-12-04 ENCOUNTER — Ambulatory Visit: Payer: Self-pay | Admitting: Registered Nurse

## 2019-12-05 ENCOUNTER — Other Ambulatory Visit: Payer: Self-pay

## 2019-12-05 ENCOUNTER — Ambulatory Visit (INDEPENDENT_AMBULATORY_CARE_PROVIDER_SITE_OTHER): Payer: Self-pay | Admitting: Registered Nurse

## 2019-12-05 ENCOUNTER — Encounter: Payer: Self-pay | Admitting: Registered Nurse

## 2019-12-05 VITALS — BP 137/91 | HR 84 | Temp 98.4°F | Ht 67.5 in | Wt 173.8 lb

## 2019-12-05 DIAGNOSIS — I10 Essential (primary) hypertension: Secondary | ICD-10-CM

## 2019-12-05 MED ORDER — AMLODIPINE BESYLATE 10 MG PO TABS: 10.0000 mg | ORAL_TABLET | Freq: Every day | ORAL | 3 refills | Status: DC

## 2019-12-05 NOTE — Patient Instructions (Addendum)
   If you have lab work done today you will be contacted with your lab results within the next 2 weeks.  If you have not heard from us then please contact us. The fastest way to get your results is to register for My Chart.   IF you received an x-ray today, you will receive an invoice from [facility]. Please contact [facility] at [phone] with questions or concerns regarding your invoice.   IF you received labwork today, you will receive an invoice from [facility]. Please contact [facility] at [phone] with questions or concerns regarding your invoice.   Our billing staff will not be able to assist you with questions regarding bills from these companies.  You will be contacted with the lab results as soon as they are available. The fastest way to get your results is to activate your My Chart account. Instructions are located on the last page of this paperwork. If you have not heard from us regarding the results in 2 weeks, please contact this office.       Hypertension, Adult High blood pressure (hypertension) is when the force of blood pumping through the arteries is too strong. The arteries are the blood vessels that carry blood from the heart throughout the body. Hypertension forces the heart to work harder to pump blood and may cause arteries to become narrow or stiff. Untreated or uncontrolled hypertension can cause a heart attack, heart failure, a stroke, kidney disease, and other problems. A blood pressure reading consists of a higher number over a lower number. Ideally, your blood pressure should be below 120/80. The first ("top") number is called the systolic pressure. It is a measure of the pressure in your arteries as your heart beats. The second ("bottom") number is called the diastolic pressure. It is a measure of the pressure in your arteries as the heart relaxes. What are the causes? The exact cause of this condition is not known. There are some  conditions that result in or are related to high blood pressure. What increases the risk? Some risk factors for high blood pressure are under your control. The following factors may make you more likely to develop this condition:  Smoking.  Having type 2 diabetes mellitus, high cholesterol, or both.  Not getting enough exercise or physical activity.  Being overweight.  Having too much fat, sugar, calories, or salt (sodium) in your diet.  Drinking too much alcohol. Some risk factors for high blood pressure may be difficult or impossible to change. Some of these factors include:  Having chronic kidney disease.  Having a family history of high blood pressure.  Age. Risk increases with age.  Race. You may be at higher risk if you are African American.  Gender. Men are at higher risk than women before age 45. After age 65, women are at higher risk than men.  Having obstructive sleep apnea.  Stress. What are the signs or symptoms? High blood pressure may not cause symptoms. Very high blood pressure (hypertensive crisis) may cause:  Headache.  Anxiety.  Shortness of breath.  Nosebleed.  Nausea and vomiting.  Vision changes.  Severe chest pain.  Seizures. How is this diagnosed? This condition is diagnosed by measuring your blood pressure while you are seated, with your arm resting on a flat surface, your legs uncrossed, and your feet flat on the floor. The cuff of the blood pressure monitor will be placed directly against the skin of your upper arm at the level of   your heart. It should be measured at least twice using the same arm. Certain conditions can cause a difference in blood pressure between your right and left arms. Certain factors can cause blood pressure readings to be lower or higher than normal for a short period of time:  When your blood pressure is higher when you are in a health care provider's office than when you are at home, this is called white coat  hypertension. Most people with this condition do not need medicines.  When your blood pressure is higher at home than when you are in a health care provider's office, this is called masked hypertension. Most people with this condition may need medicines to control blood pressure. If you have a high blood pressure reading during one visit or you have normal blood pressure with other risk factors, you may be asked to:  Return on a different day to have your blood pressure checked again.  Monitor your blood pressure at home for 1 week or longer. If you are diagnosed with hypertension, you may have other blood or imaging tests to help your health care provider understand your overall risk for other conditions. How is this treated? This condition is treated by making healthy lifestyle changes, such as eating healthy foods, exercising more, and reducing your alcohol intake. Your health care provider may prescribe medicine if lifestyle changes are not enough to get your blood pressure under control, and if:  Your systolic blood pressure is above 130.  Your diastolic blood pressure is above 80. Your personal target blood pressure may vary depending on your medical conditions, your age, and other factors. Follow these instructions at home: Eating and drinking   Eat a diet that is high in fiber and potassium, and low in sodium, added sugar, and fat. An example eating plan is called the DASH (Dietary Approaches to Stop Hypertension) diet. To eat this way: ? Eat plenty of fresh fruits and vegetables. Try to fill one half of your plate at each meal with fruits and vegetables. ? Eat whole grains, such as whole-wheat pasta, brown rice, or whole-grain bread. Fill about one fourth of your plate with whole grains. ? Eat or drink low-fat dairy products, such as skim milk or low-fat yogurt. ? Avoid fatty cuts of meat, processed or cured meats, and poultry with skin. Fill about one fourth of your plate with lean  proteins, such as fish, chicken without skin, beans, eggs, or tofu. ? Avoid pre-made and processed foods. These tend to be higher in sodium, added sugar, and fat.  Reduce your daily sodium intake. Most people with hypertension should eat less than 1,500 mg of sodium a day.  Do not drink alcohol if: ? Your health care provider tells you not to drink. ? You are pregnant, may be pregnant, or are planning to become pregnant.  If you drink alcohol: ? Limit how much you use to:  0-1 drink a day for women.  0-2 drinks a day for men. ? Be aware of how much alcohol is in your drink. In the [place] one drink equals one 12 oz bottle of beer (355 mL), one 5 oz glass of wine (148 mL), or one 1 oz glass of hard liquor (44 mL). Lifestyle   Work with your health care provider to maintain a healthy body weight or to lose weight. Ask what an ideal weight is for you.  Get at least 30 minutes of exercise most days of the week. Activities may include walking,   swimming, or biking.  Include exercise to strengthen your muscles (resistance exercise), such as Pilates or lifting weights, as part of your weekly exercise routine. Try to do these types of exercises for 30 minutes at least 3 days a week.  Do not use any products that contain nicotine or tobacco, such as cigarettes, e-cigarettes, and chewing tobacco. If you need help quitting, ask your health care provider.  Monitor your blood pressure at home as told by your health care provider.  Keep all follow-up visits as told by your health care provider. This is important. Medicines  Take over-the-counter and prescription medicines only as told by your health care provider. Follow directions carefully. Blood pressure medicines must be taken as prescribed.  Do not skip doses of blood pressure medicine. Doing this puts you at risk for problems and can make the medicine less effective.  Ask your health care provider about side effects or reactions to  medicines that you should watch for. Contact a health care provider if you:  Think you are having a reaction to a medicine you are taking.  Have headaches that keep coming back (recurring).  Feel dizzy.  Have swelling in your ankles.  Have trouble with your vision. Get help right away if you:  Develop a severe headache or confusion.  Have unusual weakness or numbness.  Feel faint.  Have severe pain in your chest or abdomen.  Vomit repeatedly.  Have trouble breathing. Summary  Hypertension is when the force of blood pumping through your arteries is too strong. If this condition is not controlled, it may put you at risk for serious complications.  Your personal target blood pressure may vary depending on your medical conditions, your age, and other factors. For most people, a normal blood pressure is less than 120/80.  Hypertension is treated with lifestyle changes, medicines, or a combination of both. Lifestyle changes include losing weight, eating a healthy, low-sodium diet, exercising more, and limiting alcohol. This information is not intended to replace advice given to you by your health care provider. Make sure you discuss any questions you have with your health care provider. Document Revised: 11/23/2017 Document Reviewed: 11/23/2017 Elsevier Patient Education  2020 [employer].   Managing Your Hypertension Hypertension is commonly called high blood pressure. This is when the force of your blood pressing against the walls of your arteries is too strong. Arteries are blood vessels that carry blood from your heart throughout your body. Hypertension forces the heart to work harder to pump blood, and may cause the arteries to become narrow or stiff. Having untreated or uncontrolled hypertension can cause heart attack, stroke, kidney disease, and other problems. What are blood pressure readings? A blood pressure reading consists of a higher number over a lower number.  Ideally, your blood pressure should be below 120/80. The first ("top") number is called the systolic pressure. It is a measure of the pressure in your arteries as your heart beats. The second ("bottom") number is called the diastolic pressure. It is a measure of the pressure in your arteries as the heart relaxes. What does my blood pressure reading mean? Blood pressure is classified into four stages. Based on your blood pressure reading, your health care provider may use the following stages to determine what type of treatment you need, if any. Systolic pressure and diastolic pressure are measured in a unit called mm Hg. Normal  Systolic pressure: below 120.  Diastolic pressure: below 80. Elevated  Systolic pressure: 120-129.  Diastolic pressure:  below 80. Hypertension stage 1  Systolic pressure: 130-139.  Diastolic pressure: 80-89. Hypertension stage 2  Systolic pressure: 140 or above.  Diastolic pressure: 90 or above. What health risks are associated with hypertension? Managing your hypertension is an important responsibility. Uncontrolled hypertension can lead to:  A heart attack.  A stroke.  A weakened blood vessel (aneurysm).  Heart failure.  Kidney damage.  Eye damage.  Metabolic syndrome.  Memory and concentration problems. What changes can I make to manage my hypertension? Hypertension can be managed by making lifestyle changes and possibly by taking medicines. Your health care provider will help you make a plan to bring your blood pressure within a normal range. Eating and drinking   Eat a diet that is high in fiber and potassium, and low in salt (sodium), added sugar, and fat. An example eating plan is called the DASH (Dietary Approaches to Stop Hypertension) diet. To eat this way: ? Eat plenty of fresh fruits and vegetables. Try to fill half of your plate at each meal with fruits and vegetables. ? Eat whole grains, such as whole wheat pasta, brown rice, or  whole grain bread. Fill about one quarter of your plate with whole grains. ? Eat low-fat diary products. ? Avoid fatty cuts of meat, processed or cured meats, and poultry with skin. Fill about one quarter of your plate with lean proteins such as fish, chicken without skin, beans, eggs, and tofu. ? Avoid premade and processed foods. These tend to be higher in sodium, added sugar, and fat.  Reduce your daily sodium intake. Most people with hypertension should eat less than 1,500 mg of sodium a day.  Limit alcohol intake to no more than 1 drink a day for nonpregnant women and 2 drinks a day for men. One drink equals 12 oz of beer, 5 oz of wine, or 1 oz of hard liquor. Lifestyle  Work with your health care provider to maintain a healthy body weight, or to lose weight. Ask what an ideal weight is for you.  Get at least 30 minutes of exercise that causes your heart to beat faster (aerobic exercise) most days of the week. Activities may include walking, swimming, or biking.  Include exercise to strengthen your muscles (resistance exercise), such as weight lifting, as part of your weekly exercise routine. Try to do these types of exercises for 30 minutes at least 3 days a week.  Do not use any products that contain nicotine or tobacco, such as cigarettes and e-cigarettes. If you need help quitting, ask your health care provider.  Control any long-term (chronic) conditions you have, such as high cholesterol or diabetes. Monitoring  Monitor your blood pressure at home as told by your health care provider. Your personal target blood pressure may vary depending on your medical conditions, your age, and other factors.  Have your blood pressure checked regularly, as often as told by your health care provider. Working with your health care provider  Review all the medicines you take with your health care provider because there may be side effects or interactions.  Talk with your health care provider  about your diet, exercise habits, and other lifestyle factors that may be contributing to hypertension.  Visit your health care provider regularly. Your health care provider can help you create and adjust your plan for managing hypertension. Will I need medicine to control my blood pressure? Your health care provider may prescribe medicine if lifestyle changes are not enough to get your blood pressure  under control, and if:  Your systolic blood pressure is 130 or higher.  Your diastolic blood pressure is 80 or higher. Take medicines only as told by your health care provider. Follow the directions carefully. Blood pressure medicines must be taken as prescribed. The medicine does not work as well when you skip doses. Skipping doses also puts you at risk for problems. Contact a health care provider if:  You think you are having a reaction to medicines you have taken.  You have repeated (recurrent) headaches.  You feel dizzy.  You have swelling in your ankles.  You have trouble with your vision. Get help right away if:  You develop a severe headache or confusion.  You have unusual weakness or numbness, or you feel faint.  You have severe pain in your chest or abdomen.  You vomit repeatedly.  You have trouble breathing. Summary  Hypertension is when the force of blood pumping through your arteries is too strong. If this condition is not controlled, it may put you at risk for serious complications.  Your personal target blood pressure may vary depending on your medical conditions, your age, and other factors. For most people, a normal blood pressure is less than 120/80.  Hypertension is managed by lifestyle changes, medicines, or both. Lifestyle changes include weight loss, eating a healthy, low-sodium diet, exercising more, and limiting alcohol. This information is not intended to replace advice given to you by your health care provider. Make sure you discuss any questions you have  with your health care provider. Document Revised: 07/07/2018 Document Reviewed: 02/11/2016 Elsevier Patient Education  2020 [employer] Inc[employer]   DASH Eating Plan DASH stands for "Dietary Approaches to Stop Hypertension." The DASH eating plan is a healthy eating plan that has been shown to reduce high blood pressure (hypertension). It may also reduce your risk for type 2 diabetes, heart disease, and stroke. The DASH eating plan may also help with weight loss. What are tips for following this plan?  General guidelines  Avoid eating more than 2,300 mg (milligrams) of salt (sodium) a day. If you have hypertension, you may need to reduce your sodium intake to 1,500 mg a day.  Limit alcohol intake to no more than 1 drink a day for nonpregnant women and 2 drinks a day for men. One drink equals 12 oz of beer, 5 oz of wine, or 1 oz of hard liquor.  Work with your health care provider to maintain a healthy body weight or to lose weight. Ask what an ideal weight is for you.  Get at least 30 minutes of exercise that causes your heart to beat faster (aerobic exercise) most days of the week. Activities may include walking, swimming, or biking.  Work with your health care provider or diet and nutrition specialist (dietitian) to adjust your eating plan to your individual calorie needs. Reading food labels   Check food labels for the amount of sodium per serving. Choose foods with less than 5 percent of the Daily Value of sodium. Generally, foods with less than 300 mg of sodium per serving fit into this eating plan.  To find whole grains, look for the word "whole" as the first word in the ingredient list. Shopping  Buy products labeled as "low-sodium" or "no salt added."  Buy fresh foods. Avoid canned foods and premade or frozen meals. Cooking  Avoid adding salt when cooking. Use salt-free seasonings or herbs instead of table salt or sea salt. Check with your health care provider  or pharmacist before  using salt substitutes.  Do not fry foods. Cook foods using healthy methods such as baking, boiling, grilling, and broiling instead.  Cook with heart-healthy oils, such as olive, canola, soybean, or sunflower oil. Meal planning  Eat a balanced diet that includes: ? 5 or more servings of fruits and vegetables each day. At each meal, try to fill half of your plate with fruits and vegetables. ? Up to 6-8 servings of whole grains each day. ? Less than 6 oz of lean meat, poultry, or fish each day. A 3-oz serving of meat is about the same size as a deck of cards. One egg equals 1 oz. ? 2 servings of low-fat dairy each day. ? A serving of nuts, seeds, or beans 5 times each week. ? Heart-healthy fats. Healthy fats called Omega-3 fatty acids are found in foods such as flaxseeds and coldwater fish, like sardines, salmon, and mackerel.  Limit how much you eat of the following: ? Canned or prepackaged foods. ? Food that is high in trans fat, such as fried foods. ? Food that is high in saturated fat, such as fatty meat. ? Sweets, desserts, sugary drinks, and other foods with added sugar. ? Full-fat dairy products.  Do not salt foods before eating.  Try to eat at least 2 vegetarian meals each week.  Eat more home-cooked food and less restaurant, buffet, and fast food.  When eating at a restaurant, ask that your food be prepared with less salt or no salt, if possible. What foods are recommended? The items listed may not be a complete list. Talk with your dietitian about what dietary choices are best for you. Grains Whole-grain or whole-wheat bread. Whole-grain or whole-wheat pasta. Brown rice. [place]. Bulgur. Whole-grain and low-sodium cereals. Pita bread. Low-fat, low-sodium crackers. Whole-wheat flour tortillas. Vegetables Fresh or frozen vegetables (raw, steamed, roasted, or grilled). Low-sodium or reduced-sodium tomato and vegetable juice. Low-sodium or reduced-sodium tomato sauce and  tomato paste. Low-sodium or reduced-sodium canned vegetables. Fruits All fresh, dried, or frozen fruit. Canned fruit in natural juice (without added sugar). Meat and other protein foods Skinless chicken or [place]. Ground chicken or [place]. Pork with fat trimmed off. Fish and seafood. Egg whites. Dried beans, peas, or lentils. Unsalted nuts, nut butters, and seeds. Unsalted canned beans. Lean cuts of beef with fat trimmed off. Low-sodium, lean deli meat. Dairy Low-fat (1%) or fat-free (skim) milk. Fat-free, low-fat, or reduced-fat cheeses. Nonfat, low-sodium ricotta or cottage cheese. Low-fat or nonfat yogurt. Low-fat, low-sodium cheese. Fats and oils Soft margarine without trans fats. Vegetable oil. Low-fat, reduced-fat, or light mayonnaise and salad dressings (reduced-sodium). Canola, safflower, olive, soybean, and sunflower oils. Avocado. Seasoning and other foods Herbs. Spices. Seasoning mixes without salt. Unsalted popcorn and pretzels. Fat-free sweets. What foods are not recommended? The items listed may not be a complete list. Talk with your dietitian about what dietary choices are best for you. Grains Baked goods made with fat, such as croissants, muffins, or some breads. Dry pasta or rice meal packs. Vegetables Creamed or fried vegetables. Vegetables in a cheese sauce. Regular canned vegetables (not low-sodium or reduced-sodium). Regular canned tomato sauce and paste (not low-sodium or reduced-sodium). Regular tomato and vegetable juice (not low-sodium or reduced-sodium). Angie Fava. Olives. Fruits Canned fruit in a light or heavy syrup. Fried fruit. Fruit in cream or butter sauce. Meat and other protein foods Fatty cuts of meat. Ribs. Fried meat. Berniece Salines. Sausage. Bologna and other processed lunch meats. Salami. Fatback. Hotdogs. Bratwurst.  Salted nuts and seeds. Canned beans with added salt. Canned or smoked fish. Whole eggs or egg yolks. Chicken or [place] with skin. Dairy Whole or 2%  milk, cream, and half-and-half. Whole or full-fat cream cheese. Whole-fat or sweetened yogurt. Full-fat cheese. Nondairy creamers. Whipped toppings. Processed cheese and cheese spreads. Fats and oils Butter. Stick margarine. Lard. Shortening. Ghee. Bacon fat. Tropical oils, such as coconut, palm kernel, or palm oil. Seasoning and other foods Salted popcorn and pretzels. Onion salt, garlic salt, seasoned salt, table salt, and sea salt. Worcestershire sauce. Tartar sauce. Barbecue sauce. Teriyaki sauce. Soy sauce, including reduced-sodium. Steak sauce. Canned and packaged gravies. Fish sauce. Oyster sauce. Cocktail sauce. Horseradish that you find on the shelf. Ketchup. Mustard. Meat flavorings and tenderizers. Bouillon cubes. Hot sauce and Tabasco sauce. Premade or packaged marinades. Premade or packaged taco seasonings. Relishes. Regular salad dressings. Where to find more information:  National Heart, Lung, and Blood Institute: PopSteam.is  American Heart Association: www.heart.org Summary  The DASH eating plan is a healthy eating plan that has been shown to reduce high blood pressure (hypertension). It may also reduce your risk for type 2 diabetes, heart disease, and stroke.  With the DASH eating plan, you should limit salt (sodium) intake to 2,300 mg a day. If you have hypertension, you may need to reduce your sodium intake to 1,500 mg a day.  When on the DASH eating plan, aim to eat more fresh fruits and vegetables, whole grains, lean proteins, low-fat dairy, and heart-healthy fats.  Work with your health care provider or diet and nutrition specialist (dietitian) to adjust your eating plan to your individual calorie needs. This information is not intended to replace advice given to you by your health care provider. Make sure you discuss any questions you have with your health care provider. Document Revised: 02/25/2017 Document Reviewed: 03/08/2016 Elsevier Patient Education  2020  Elsevier Inc.   Low-Sodium Eating Plan Sodium, which is an element that makes up salt, helps you maintain a healthy balance of fluids in your body. Too much sodium can increase your blood pressure and cause fluid and waste to be held in your body. Your health care provider or dietitian may recommend following this plan if you have high blood pressure (hypertension), kidney disease, liver disease, or heart failure. Eating less sodium can help lower your blood pressure, reduce swelling, and protect your heart, liver, and kidneys. What are tips for following this plan? General guidelines  Most people on this plan should limit their sodium intake to 1,500-2,000 mg (milligrams) of sodium each day. Reading food labels   The Nutrition Facts label lists the amount of sodium in one serving of the food. If you eat more than one serving, you must multiply the listed amount of sodium by the number of servings.  Choose foods with less than 140 mg of sodium per serving.  Avoid foods with 300 mg of sodium or more per serving. Shopping  Look for lower-sodium products, often labeled as "low-sodium" or "no salt added."  Always check the sodium content even if foods are labeled as "unsalted" or "no salt added".  Buy fresh foods. ? Avoid canned foods and premade or frozen meals. ? Avoid canned, cured, or processed meats  Buy breads that have less than 80 mg of sodium per slice. Cooking  Eat more home-cooked food and less restaurant, buffet, and fast food.  Avoid adding salt when cooking. Use salt-free seasonings or herbs instead of table salt or sea salt.  Check with your health care provider or pharmacist before using salt substitutes.  Cook with plant-based oils, such as canola, sunflower, or olive oil. Meal planning  When eating at a restaurant, ask that your food be prepared with less salt or no salt, if possible.  Avoid foods that contain MSG (monosodium glutamate). MSG is sometimes added to  Congohinese food, bouillon, and some canned foods. What foods are recommended? The items listed may not be a complete list. Talk with your dietitian about what dietary choices are best for you. Grains Low-sodium cereals, including oats, puffed wheat and rice, and shredded wheat. Low-sodium crackers. Unsalted rice. Unsalted pasta. Low-sodium bread. Whole-grain breads and whole-grain pasta. Vegetables Fresh or frozen vegetables. "No salt added" canned vegetables. "No salt added" tomato sauce and paste. Low-sodium or reduced-sodium tomato and vegetable juice. Fruits Fresh, frozen, or canned fruit. Fruit juice. Meats and other protein foods Fresh or frozen (no salt added) meat, poultry, seafood, and fish. Low-sodium canned tuna and salmon. Unsalted nuts. Dried peas, beans, and lentils without added salt. Unsalted canned beans. Eggs. Unsalted nut butters. Dairy Milk. Soy milk. Cheese that is naturally low in sodium, such as ricotta cheese, fresh mozzarella, or Swiss cheese Low-sodium or reduced-sodium cheese. Cream cheese. Yogurt. Fats and oils Unsalted butter. Unsalted margarine with no trans fat. Vegetable oils such as canola or olive oils. Seasonings and other foods Fresh and dried herbs and spices. Salt-free seasonings. Low-sodium mustard and ketchup. Sodium-free salad dressing. Sodium-free light mayonnaise. Fresh or refrigerated horseradish. Lemon juice. Vinegar. Homemade, reduced-sodium, or low-sodium soups. Unsalted popcorn and pretzels. Low-salt or salt-free chips. What foods are not recommended? The items listed may not be a complete list. Talk with your dietitian about what dietary choices are best for you. Grains Instant hot cereals. Bread stuffing, pancake, and biscuit mixes. Croutons. Seasoned rice or pasta mixes. Noodle soup cups. Boxed or frozen macaroni and cheese. Regular salted crackers. Self-rising flour. Vegetables Sauerkraut, pickled vegetables, and relishes. Olives. Jamaica[place] fries.  Onion rings. Regular canned vegetables (not low-sodium or reduced-sodium). Regular canned tomato sauce and paste (not low-sodium or reduced-sodium). Regular tomato and vegetable juice (not low-sodium or reduced-sodium). Frozen vegetables in sauces. Meats and other protein foods Meat or fish that is salted, canned, smoked, spiced, or pickled. Bacon, ham, sausage, hotdogs, corned beef, chipped beef, packaged lunch meats, salt pork, jerky, pickled herring, anchovies, regular canned tuna, sardines, salted nuts. Dairy Processed cheese and cheese spreads. Cheese curds. Blue cheese. Feta cheese. String cheese. Regular cottage cheese. Buttermilk. Canned milk. Fats and oils Salted butter. Regular margarine. Ghee. Bacon fat. Seasonings and other foods Onion salt, garlic salt, seasoned salt, table salt, and sea salt. Canned and packaged gravies. Worcestershire sauce. Tartar sauce. Barbecue sauce. Teriyaki sauce. Soy sauce, including reduced-sodium. Steak sauce. Fish sauce. Oyster sauce. Cocktail sauce. Horseradish that you find on the shelf. Regular ketchup and mustard. Meat flavorings and tenderizers. Bouillon cubes. Hot sauce and Tabasco sauce. Premade or packaged marinades. Premade or packaged taco seasonings. Relishes. Regular salad dressings. Salsa. Potato and tortilla chips. Corn chips and puffs. Salted popcorn and pretzels. Canned or dried soups. Pizza. Frozen entrees and pot pies. Summary  Eating less sodium can help lower your blood pressure, reduce swelling, and protect your heart, liver, and kidneys.  Most people on this plan should limit their sodium intake to 1,500-2,000 mg (milligrams) of sodium each day.  Canned, boxed, and frozen foods are high in sodium. Restaurant foods, fast foods, and pizza are also very high in sodium.  You also get sodium by adding salt to food.  Try to cook at home, eat more fresh fruits and vegetables, and eat less fast food, canned, processed, or prepared foods. This  information is not intended to replace advice given to you by your health care provider. Make sure you discuss any questions you have with your health care provider. Document Revised: 02/25/2017 Document Reviewed: 03/08/2016 Elsevier Patient Education  2020 [employer].

## 2020-02-18 NOTE — Progress Notes (Signed)
Established Patient Office Visit  Subjective:  Patient ID: Todd Maynard, male    DOB: 1960-09-16  Age: 59 y.o. MRN: [id number]  CC:  Chief Complaint  Patient presents with  . Hypertension    f/u     HPI Todd Maynard presents for HTN follow up  Hypertension: Patient Currently taking: amlodipine 10mg  PO qd Good effect. No AEs. Denies CV symptoms including: chest pain, shob, doe, headache, visual changes, fatigue, claudication, and dependent edema.   Previous readings and labs: BP Readings from Last 3 Encounters:  12/05/19 (!) 137/91  10/11/19 (!) 148/94  09/20/19 (!) 148/100   Lab Results  Component Value Date   CREATININE 1.80 (H) 09/20/2019      Past Medical History:  Diagnosis Date  . Hypertension   . Neuromuscular disorder (HCC)     No past surgical history on file.  No family history on file.  Social History   Socioeconomic History  . Marital status: Married    Spouse name: Not on file  . Number of children: Not on file  . Years of education: Not on file  . Highest education level: Not on file  Occupational History  . Not on file  Tobacco Use  . Smoking status: Never Smoker  . Smokeless tobacco: Never Used  Substance and Sexual Activity  . Alcohol use: Not on file  . Drug use: Not on file  . Sexual activity: Not on file  Other Topics Concern  . Not on file  Social History Narrative  . Not on file   Social Determinants of Health   Financial Resource Strain:   . Difficulty of Paying Living Expenses: Not on file  Food Insecurity:   . Worried About 09/22/2019 in the Last Year: Not on file  . Ran Out of Food in the Last Year: Not on file  Transportation Needs:   . Lack of Transportation (Medical): Not on file  . Lack of Transportation (Non-Medical): Not on file  Physical Activity:   . Days of Exercise per Week: Not on file  . Minutes of Exercise per Session: Not on file  Stress:   . Feeling of Stress : Not on file  Social  Connections:   . Frequency of Communication with Friends and Family: Not on file  . Frequency of Social Gatherings with Friends and Family: Not on file  . Attends Religious Services: Not on file  . Active Member of Clubs or Organizations: Not on file  . Attends Programme researcher, broadcasting/film/video Meetings: Not on file  . Marital Status: Not on file  Intimate Partner Violence:   . Fear of Current or Ex-Partner: Not on file  . Emotionally Abused: Not on file  . Physically Abused: Not on file  . Sexually Abused: Not on file    Outpatient Medications Prior to Visit  Medication Sig Dispense Refill  . losartan (COZAAR) 100 MG tablet Take 1 tablet (100 mg total) by mouth daily. 90 tablet 1  . amLODipine (NORVASC) 5 MG tablet Take one po qhs 90 tablet 0  . Simethicone 80 MG TABS Take 1 tablet (80 mg total) by mouth 3 (three) times daily as needed. 90 tablet 1   No facility-administered medications prior to visit.    No Known Allergies  ROS Review of Systems  Constitutional: Negative.   HENT: Negative.   Eyes: Negative.   Respiratory: Negative.   Cardiovascular: Negative.   Gastrointestinal: Negative.   Genitourinary: Negative.   Musculoskeletal: Negative.  Skin: Negative.   Neurological: Negative.   Psychiatric/Behavioral: Negative.       Objective:    Physical Exam Constitutional:      General: He is not in acute distress.    Appearance: Normal appearance. He is normal weight. He is not ill-appearing, toxic-appearing or diaphoretic.  Cardiovascular:     Rate and Rhythm: Normal rate and regular rhythm.     Heart sounds: Normal heart sounds. No murmur heard.  No friction rub. No gallop.   Pulmonary:     Effort: Pulmonary effort is normal. No respiratory distress.     Breath sounds: Normal breath sounds. No stridor. No wheezing, rhonchi or rales.  Chest:     Chest wall: No tenderness.  Neurological:     General: No focal deficit present.     Mental Status: He is alert and oriented  to person, place, and time. Mental status is at baseline.  Psychiatric:        Mood and Affect: Mood normal.        Behavior: Behavior normal.        Thought Content: Thought content normal.        Judgment: Judgment normal.     BP (!) 137/91   Pulse 84   Temp 98.4 F (36.9 C) (Temporal)   Ht 5' 7.5" (1.715 m)   Wt 173 lb 12.8 oz (78.8 kg)   SpO2 100%   BMI 26.82 kg/m  Wt Readings from Last 3 Encounters:  12/05/19 173 lb 12.8 oz (78.8 kg)  10/11/19 174 lb 3.2 oz (79 kg)  09/20/19 175 lb 9.6 oz (79.7 kg)     Health Maintenance Due  Topic Date Due  . Hepatitis C Screening  Never done  . COVID-19 Vaccine (1) Never done  . HIV Screening  Never done  . INFLUENZA VACCINE  10/28/2019    There are no preventive care reminders to display for this patient.  Lab Results  Component Value Date   TSH 1.580 09/20/2019   Lab Results  Component Value Date   WBC 3.2 (A) 07/16/2016   HGB 14.5 07/16/2016   HCT 43.0 (A) 07/16/2016   MCV 86.2 07/16/2016   Lab Results  Component Value Date   NA 142 09/20/2019   K 3.8 09/20/2019   CO2 23 09/20/2019   GLUCOSE 93 09/20/2019   BUN 20 09/20/2019   CREATININE 1.80 (H) 09/20/2019   BILITOT 0.4 09/20/2019   ALKPHOS 93 09/20/2019   AST 19 09/20/2019   ALT 14 09/20/2019   PROT 7.3 09/20/2019   ALBUMIN 4.6 09/20/2019   CALCIUM 10.0 09/20/2019   Lab Results  Component Value Date   CHOL 165 09/21/2018   Lab Results  Component Value Date   HDL 45 09/21/2018   Lab Results  Component Value Date   LDLCALC 102 (H) 09/21/2018   Lab Results  Component Value Date   TRIG 91 09/21/2018   Lab Results  Component Value Date   CHOLHDL 3.7 09/21/2018   No results found for: HGBA1C    Assessment & Plan:   Problem List Items Addressed This Visit      Cardiovascular and Mediastinum   Essential hypertension - Primary   Relevant Medications   amLODipine (NORVASC) 10 MG tablet      Meds ordered this encounter  Medications  .  amLODipine (NORVASC) 10 MG tablet    Sig: Take 1 tablet (10 mg total) by mouth daily.    Dispense:  90 tablet  Refill:  3    Order Specific Question:   Supervising Provider    Answer:   Neva Seat, JEFFREY R [[id number]]    Follow-up: Return in about 3 months (around 03/05/2020).    PLAN  No acute concerns  Mild bp elevation today, pt asymptomatic, would prefer to look into lifestyle modifications  Recheck in 3 mo  Needs labs. Pt understands risks of delaying these.  Patient encouraged to call clinic with any questions, comments, or concerns.  Todd Agee, NP

## 2020-03-05 ENCOUNTER — Ambulatory Visit (INDEPENDENT_AMBULATORY_CARE_PROVIDER_SITE_OTHER): Payer: Self-pay | Admitting: Registered Nurse

## 2020-03-05 ENCOUNTER — Encounter: Payer: Self-pay | Admitting: Registered Nurse

## 2020-03-05 ENCOUNTER — Other Ambulatory Visit: Payer: Self-pay

## 2020-03-05 VITALS — BP 149/91 | HR 82 | Temp 97.6°F | Resp 18 | Ht 67.5 in | Wt 175.6 lb

## 2020-03-05 DIAGNOSIS — R944 Abnormal results of kidney function studies: Secondary | ICD-10-CM

## 2020-03-05 DIAGNOSIS — I10 Essential (primary) hypertension: Secondary | ICD-10-CM

## 2020-03-05 DIAGNOSIS — Z23 Encounter for immunization: Secondary | ICD-10-CM

## 2020-03-05 LAB — BASIC METABOLIC PANEL
BUN/Creatinine Ratio: 13 (ref 10–24)
BUN: 21 mg/dL (ref 8–27)
CO2: 20 mmol/L (ref 20–29)
Calcium: 9.8 mg/dL (ref 8.6–10.2)
Chloride: 105 mmol/L (ref 96–106)
Creatinine, Ser: 1.61 mg/dL — ABNORMAL HIGH (ref 0.76–1.27)
GFR calc Af Amer: 53 mL/min/{1.73_m2} — ABNORMAL LOW (ref >59)
GFR calc non Af Amer: 45 mL/min/{1.73_m2} — ABNORMAL LOW (ref >59)
Glucose: 98 mg/dL (ref 65–99)
Potassium: 3.5 mmol/L (ref 3.5–5.2)
Sodium: 142 mmol/L (ref 134–144)

## 2020-03-05 MED ORDER — AMLODIPINE BESYLATE 10 MG PO TABS: 10.0000 mg | ORAL_TABLET | Freq: Every day | ORAL | 3 refills | Status: DC

## 2020-03-05 MED ORDER — LOSARTAN POTASSIUM 100 MG PO TABS: 100.0000 mg | ORAL_TABLET | Freq: Every day | ORAL | 1 refills | Status: DC

## 2020-03-05 NOTE — Patient Instructions (Signed)
° ° ° °  If you have lab work done today you will be contacted with your lab results within the next 2 weeks.  If you have not heard from us then please contact us. The fastest way to get your results is to register for My Chart. ° ° °IF you received an x-ray today, you will receive an invoice from [facility]. Please contact [facility] at [phone] with questions or concerns regarding your invoice.  ° °IF you received labwork today, you will receive an invoice from [facility]. Please contact [facility] at [phone] with questions or concerns regarding your invoice.  ° °Our billing staff will not be able to assist you with questions regarding bills from these companies. ° °You will be contacted with the lab results as soon as they are available. The fastest way to get your results is to activate your My Chart account. Instructions are located on the last page of this paperwork. If you have not heard from us regarding the results in 2 weeks, please contact this office. °  ° ° ° °

## 2020-03-05 NOTE — Progress Notes (Addendum)
Established Patient Office Visit  Subjective:  Patient ID: Todd Maynard, male    DOB: March 23, 1961  Age: 59 y.o. MRN: [id number]  CC:  Chief Complaint  Patient presents with  . Transitions Of Care    Patient states he is here for transfer of care and also follow up for Hypertension.    HPI Todd Maynard presents for visit to est care. Has moved up to [place] area from [place], closer to brother, who is here with him today for visit. Has been seen by myself and Dr. Alwyn Ren previously Hx of neuromuscular disorder - no current symptoms beyond benign essential tremor - no progression or changes noted. Hx of skin lesion on R back/shoulder. Sent to [facility], biopsied, awaiting results. Not available in epic so far as I can tell at this time.  Main concern is htn: Hypertension: Patient Currently taking: amlodipine 10mg  and losartan 100mg  both PO qd. Good effect. No AEs. Denies CV symptoms including: chest pain, shob, doe, headache, visual changes, fatigue, claudication, and dependent edema.   Previous readings and labs: BP Readings from Last 3 Encounters:  03/05/20 (!) 149/91  12/05/19 (!) 137/91  10/11/19 (!) 148/94   Lab Results  Component Value Date   CREATININE 1.61 (H) 03/05/2020   With his history of decreased renal function will recheck today Notes hx of whitecoat hypertension - usually bp is lower at home and when patient is more relaxed No major concerns.    Past Medical History:  Diagnosis Date  . Hypertension   . Neuromuscular disorder (HCC)     History reviewed. No pertinent surgical history.  Family History  Problem Relation Age of Onset  . Breast cancer Mother   . Heart attack Father   . Leukemia Father     Social History   Socioeconomic History  . Marital status: Married    Spouse name: Not on file  . Number of children: Not on file  . Years of education: Not on file  . Highest education level: Not on file  Occupational History  . Not on  file  Tobacco Use  . Smoking status: Never Smoker  . Smokeless tobacco: Never Used  Vaping Use  . Vaping Use: Never used  Substance and Sexual Activity  . Alcohol use: Not Currently  . Drug use: Never  . Sexual activity: Not on file  Other Topics Concern  . Not on file  Social History Narrative  . Not on file   Social Determinants of Health   Financial Resource Strain: Not on file  Food Insecurity: Not on file  Transportation Needs: Not on file  Physical Activity: Not on file  Stress: Not on file  Social Connections: Not on file  Intimate Partner Violence: Not on file    Outpatient Medications Prior to Visit  Medication Sig Dispense Refill  . amLODipine (NORVASC) 10 MG tablet Take 1 tablet (10 mg total) by mouth daily. 90 tablet 3  . losartan (COZAAR) 100 MG tablet Take 1 tablet (100 mg total) by mouth daily. 90 tablet 1   No facility-administered medications prior to visit.    No Known Allergies  ROS Review of Systems  Constitutional: Negative.   HENT: Negative.   Eyes: Negative.   Respiratory: Negative.   Cardiovascular: Negative.   Gastrointestinal: Negative.   Endocrine: Negative.   Genitourinary: Negative.   Musculoskeletal: Negative.   Skin: Negative.   Allergic/Immunologic: Negative.   Neurological: Negative.   Hematological: Negative.   Psychiatric/Behavioral: Negative for agitation,  behavioral problems, confusion, decreased concentration, dysphoric mood, hallucinations, self-injury, sleep disturbance and suicidal ideas. The patient is nervous/anxious (pt and brother endorse this as baseline). The patient is not hyperactive.   All other systems reviewed and are negative.     Objective:    Physical Exam Constitutional:      General: He is not in acute distress.    Appearance: Normal appearance. He is normal weight. He is not ill-appearing, toxic-appearing or diaphoretic.  Cardiovascular:     Rate and Rhythm: Normal rate and regular rhythm.      Heart sounds: Normal heart sounds. No murmur heard.  No friction rub. No gallop.   Pulmonary:     Effort: Pulmonary effort is normal. No respiratory distress.     Breath sounds: Normal breath sounds. No stridor. No wheezing, rhonchi or rales.  Chest:     Chest wall: No tenderness.  Neurological:     General: No focal deficit present.     Mental Status: He is alert and oriented to person, place, and time. Mental status is at baseline.  Psychiatric:        Mood and Affect: Mood normal.        Behavior: Behavior normal.        Thought Content: Thought content normal.        Judgment: Judgment normal.     BP (!) 149/91   Pulse 82   Temp 97.6 F (36.4 C) (Temporal)   Resp 18   Ht 5' 7.5" (1.715 m)   Wt 175 lb 9.6 oz (79.7 kg)   SpO2 100%   BMI 27.10 kg/m  Wt Readings from Last 3 Encounters:  03/05/20 175 lb 9.6 oz (79.7 kg)  12/05/19 173 lb 12.8 oz (78.8 kg)  10/11/19 174 lb 3.2 oz (79 kg)     Health Maintenance Due  Topic Date Due  . COVID-19 Vaccine (1) Never done  . INFLUENZA VACCINE  10/28/2019    There are no preventive care reminders to display for this patient.  Lab Results  Component Value Date   TSH 1.580 09/20/2019   Lab Results  Component Value Date   WBC 3.2 (A) 07/16/2016   HGB 14.5 07/16/2016   HCT 43.0 (A) 07/16/2016   MCV 86.2 07/16/2016   Lab Results  Component Value Date   NA 142 03/05/2020   K 3.5 03/05/2020   CO2 20 03/05/2020   GLUCOSE 98 03/05/2020   BUN 21 03/05/2020   CREATININE 1.61 (H) 03/05/2020   BILITOT 0.4 09/20/2019   ALKPHOS 93 09/20/2019   AST 19 09/20/2019   ALT 14 09/20/2019   PROT 7.3 09/20/2019   ALBUMIN 4.6 09/20/2019   CALCIUM 9.8 03/05/2020   Lab Results  Component Value Date   CHOL 165 09/21/2018   Lab Results  Component Value Date   HDL 45 09/21/2018   Lab Results  Component Value Date   LDLCALC 102 (H) 09/21/2018   Lab Results  Component Value Date   TRIG 91 09/21/2018   Lab Results  Component  Value Date   CHOLHDL 3.7 09/21/2018   No results found for: HGBA1C    Assessment & Plan:   Problem List Items Addressed This Visit      Cardiovascular and Mediastinum   Essential hypertension   Relevant Medications   losartan (COZAAR) 100 MG tablet   amLODipine (NORVASC) 10 MG tablet    Other Visit Diagnoses    Decreased GFR    -  Primary  Relevant Orders   Basic Metabolic Panel (Completed)      Meds ordered this encounter  Medications  . losartan (COZAAR) 100 MG tablet    Sig: Take 1 tablet (100 mg total) by mouth daily.    Dispense:  90 tablet    Refill:  1    Order Specific Question:   Supervising Provider    Answer:   Neva Seat, JEFFREY R [[id number]]  . amLODipine (NORVASC) 10 MG tablet    Sig: Take 1 tablet (10 mg total) by mouth daily.    Dispense:  90 tablet    Refill:  3    Order Specific Question:   Supervising Provider    Answer:   Neva Seat, JEFFREY R [[id number]]    Follow-up: Return in about 6 months (around 09/03/2020) for htn follow up .   PLAN  bp down to 138/90 at end of visit. Permissible as he has not taken his meds today  Refill meds x 6 mo  Return for bp check  Labs collected. Will follow up with the patient as warranted.  Patient encouraged to call clinic with any questions, comments, or concerns.  Janeece Agee, NP

## 2020-03-06 NOTE — Progress Notes (Signed)
Hello,  Normal results letter, please.  Thanks,  Rich Latasha Puskas, NP 

## 2020-03-07 ENCOUNTER — Other Ambulatory Visit: Payer: Self-pay | Admitting: Family Medicine

## 2020-03-07 ENCOUNTER — Telehealth: Payer: Self-pay | Admitting: Registered Nurse

## 2020-03-07 DIAGNOSIS — I10 Essential (primary) hypertension: Secondary | ICD-10-CM

## 2020-03-07 NOTE — Telephone Encounter (Signed)
What is the name of the medication? losartan (COZAAR) 100 MG tablet [[id number]]    Have you contacted your pharmacy to request a refill? Her script was sent to Pharmacy  [facility][id number] [place], [place] - [address] AT [facility] [address]  [address] [place], [place] [address]  Phone:  [phone] Fax:  [phone]  DEA #:  [id number]  I informed pt he  just needed to call to have his  script transferred, he wants nothing to do with [facility].    Which pharmacy would you like this sent to? [facility] [address]   Patient notified that their request is being sent to the clinical staff for review and that they should receive a call once it is complete. If they do not receive a call within 72 hours they can check with their pharmacy or our office.

## 2020-03-07 NOTE — Telephone Encounter (Signed)
Rx has already been sent a couple of days ago. # 90 +1

## 2020-09-03 ENCOUNTER — Ambulatory Visit: Payer: Self-pay | Admitting: Registered Nurse

## 2020-09-03 DIAGNOSIS — Z0289 Encounter for other administrative examinations: Secondary | ICD-10-CM

## 2020-09-30 ENCOUNTER — Other Ambulatory Visit: Payer: Self-pay | Admitting: Registered Nurse

## 2020-09-30 DIAGNOSIS — I10 Essential (primary) hypertension: Secondary | ICD-10-CM

## 2021-02-02 ENCOUNTER — Telehealth: Payer: Self-pay | Admitting: Registered Nurse

## 2021-02-02 NOTE — Telephone Encounter (Signed)
Pt. Wants to see

## 2021-02-06 ENCOUNTER — Ambulatory Visit: Payer: Self-pay | Admitting: Student

## 2021-02-06 ENCOUNTER — Other Ambulatory Visit (HOSPITAL_COMMUNITY): Payer: Self-pay

## 2021-02-06 DIAGNOSIS — I10 Essential (primary) hypertension: Secondary | ICD-10-CM

## 2021-02-06 DIAGNOSIS — Z7689 Persons encountering health services in other specified circumstances: Secondary | ICD-10-CM

## 2021-02-06 MED ORDER — LOSARTAN POTASSIUM 100 MG PO TABS
100.0000 mg | ORAL_TABLET | Freq: Every day | ORAL | 3 refills | Status: DC
  Filled 2021-02-06: qty 30, 30d supply, fill #0
  Filled 2021-03-25: qty 30, 30d supply, fill #1

## 2021-02-06 MED ORDER — AMLODIPINE BESYLATE 10 MG PO TABS
10.0000 mg | ORAL_TABLET | Freq: Every day | ORAL | 3 refills | Status: AC
  Filled 2021-02-06: qty 30, 30d supply, fill #0
  Filled 2021-03-25: qty 30, 30d supply, fill #1
  Filled 2021-06-12: qty 30, 30d supply, fill #2

## 2021-02-06 NOTE — Patient Instructions (Signed)
[name], it was a pleasure seeing you today!  Today we discussed: - Blood pressure: Let's re-start your medications (losartan and amlodipine). Please take these daily. I have sent these to the the [facility].  - Orange card: Please make sure to fill out that information and send it in. We would like for you to have this so we can refer you for a colonoscopy.   I have ordered the following labs today:   Lab Orders         BMP8+Anion Gap      I have ordered the following medication/changed the following medications:   Start the following medications: Meds ordered this encounter  Medications   losartan (COZAAR) 100 MG tablet    Sig: Take 1 tablet (100 mg total) by mouth daily.    Dispense:  90 tablet    Refill:  3    IM program   amLODipine (NORVASC) 10 MG tablet    Sig: Take 1 tablet (10 mg total) by mouth daily.    Dispense:  90 tablet    Refill:  3    IM program     Follow-up: 6 months   Please make sure to arrive 15 minutes prior to your next appointment. If you arrive late, you may be asked to reschedule.   We look forward to seeing you next time. Please call our clinic at [phone] if you have any questions or concerns. The best time to call is Monday-Friday from 9am-4pm, but there is someone available 24/7. If after hours or the weekend, call the main hospital number and ask for the Internal Medicine Resident On-Call. If you need medication refills, please notify your pharmacy one week in advance and they will send [place] a request.  Thank you for letting us take part in your care. Wishing you the best!  Thank you, Evlyn Kanner, MD

## 2021-02-06 NOTE — Assessment & Plan Note (Addendum)
Todd Maynard is presenting to [facility] today to establish care. His previous physician moved out of town, so he chose to stay closer to home with our practice. He reports hypertension is his only past medical history. He does take losartan and amlodipine, although he has been out of these medications. Otherwise, he has no medical concerns today. Denies any cardiac conditions, diabetes, or prior strokes.   He reports that he is able to perform his ADL's and IADl's independently. 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 He has lived here in [place] his whole life. Denies current or history of tobacco, alcohol, or recreational drug use.   PHQ-9 2 today. Patient has received two COVID-19 vaccines, but was unable to bring his card today. He does report receiving Tdap vaccine in 2020. He also has never had a colonoscopy before. Financial paperwork was given to him today in order to obtain an orange card. Emphasized the importance of getting this card to patient and his wife.  Today we will obtain BMET to monitor renal function and screen for diabetes (patient is currently fasting). At next visit can consider checking CBC, lipid panel. Hopeful patient will have orange card at that time. Patient to follow-up with [facility] in six months.

## 2021-02-06 NOTE — Progress Notes (Signed)
   CC: establish care  HPI:  [name] is a 60 y.o. with hypertension presenting to [facility] today to establish care.  Please see problem-based list for further details.  Past Medical History:  Diagnosis Date   Hypertension    Neuromuscular disorder (HCC)    Review of Systems:  As per HPI  Physical Exam:  Vitals:   02/06/21 0917  BP: (!) 154/97  Pulse: 69  Temp: 98.3 F (36.8 C)  TempSrc: Oral  SpO2: 100%  Weight: 175 lb 11.2 oz (79.7 kg)  Height: 5\' 7"  (1.702 m)   General: Resting comfortably in chair, no acute distress HENT: Normocephalic, atraumatic. No cervical or supraclavicular lymphadenopathy. CV: Regular rate, rhythm. No murmurs appreciated. Distal pulses 2+ bilaterally Pulm: Normal work of breathing on room air. Clear to auscultation bilaterally. GI: Abdomen soft, non-tender, non-distended. Normoactive bowel sounds. MSK: Normal bulk, tone. No pitting edema bilateral lower extremities.  Skin: Warm, dry. No rashes or lesions appreciated. Neuro: Awake, alert, conversing appropriately. No focal deficits. Psych: Normal mood, affect, speech.  Assessment & Plan:   See Encounters Tab for problem based charting.  Patient discussed with Dr.  

## 2021-02-06 NOTE — Assessment & Plan Note (Signed)
BP Readings from Last 3 Encounters:  02/06/21 (!) 154/97  03/05/20 (!) 149/91  12/05/19 (!) 137/91   Blood pressure elevated to 154/97 today. Patient has been out of his medications for awhile. He denies any chest pain, dyspnea, lightheadedness, syncopal episodes. We will re-start his home medications and follow-up with BMP.  - Restart losartan 100mg , amlodipine 10mg  daily - BMET today

## 2021-02-07 LAB — BMP8+ANION GAP
Anion Gap: 13 mmol/L (ref 10.0–18.0)
BUN/Creatinine Ratio: 14 (ref 10–24)
BUN: 26 mg/dL (ref 8–27)
CO2: 23 mmol/L (ref 20–29)
Calcium: 9.3 mg/dL (ref 8.6–10.2)
Chloride: 106 mmol/L (ref 96–106)
Creatinine, Ser: 1.92 mg/dL — ABNORMAL HIGH (ref 0.76–1.27)
Glucose: 85 mg/dL (ref 70–99)
Potassium: 3.9 mmol/L (ref 3.5–5.2)
Sodium: 142 mmol/L (ref 134–144)
eGFR: 39 mL/min/{1.73_m2} — ABNORMAL LOW (ref >59)

## 2021-02-09 NOTE — Progress Notes (Signed)
Internal Medicine Clinic Attending ? ?Case discussed with Dr. Braswell  At the time of the visit.  We reviewed the resident?s history and exam and pertinent patient test results.  I agree with the assessment, diagnosis, and plan of care documented in the resident?s note.  ?

## 2021-02-10 ENCOUNTER — Other Ambulatory Visit: Payer: Self-pay | Admitting: Student

## 2021-02-10 DIAGNOSIS — I10 Essential (primary) hypertension: Secondary | ICD-10-CM

## 2021-02-12 ENCOUNTER — Telehealth: Payer: Self-pay | Admitting: Student

## 2021-02-12 NOTE — Telephone Encounter (Signed)
Please refer to message below.  Patient agree to an appointment on next Tuesday 02/17/21 at 2 pm.

## 2021-02-12 NOTE — Telephone Encounter (Signed)
-----   Message from Evlyn Kanner, MD sent at 02/10/2021 11:24 AM EST ----- Good morning! Could we schedule Mr. Rousseau for a lab-only appointment towards the end of this week or beginning of next week?  Thanks! Aneta Mins

## 2021-02-17 ENCOUNTER — Other Ambulatory Visit: Payer: Self-pay

## 2021-02-17 DIAGNOSIS — I10 Essential (primary) hypertension: Secondary | ICD-10-CM

## 2021-02-18 LAB — BMP8+ANION GAP
Anion Gap: 15 mmol/L (ref 10.0–18.0)
BUN/Creatinine Ratio: 10 (ref 10–24)
BUN: 18 mg/dL (ref 8–27)
CO2: 22 mmol/L (ref 20–29)
Calcium: 9.6 mg/dL (ref 8.6–10.2)
Chloride: 105 mmol/L (ref 96–106)
Creatinine, Ser: 1.87 mg/dL — ABNORMAL HIGH (ref 0.76–1.27)
Glucose: 92 mg/dL (ref 70–99)
Potassium: 4.1 mmol/L (ref 3.5–5.2)
Sodium: 142 mmol/L (ref 134–144)
eGFR: 40 mL/min/{1.73_m2} — ABNORMAL LOW (ref >59)

## 2021-03-24 ENCOUNTER — Other Ambulatory Visit: Payer: Self-pay

## 2021-03-24 DIAGNOSIS — I10 Essential (primary) hypertension: Secondary | ICD-10-CM

## 2021-03-24 NOTE — Telephone Encounter (Signed)
Change in Pharmacy to  [facility].  Last visit 02/17/2021.

## 2021-03-24 NOTE — Telephone Encounter (Signed)
losartan (COZAAR) 100 MG tablet, amLODipine (NORVASC) 10 MG tablet refill request @ [facility] [address] [place], [place] - [address]

## 2021-03-25 ENCOUNTER — Other Ambulatory Visit (HOSPITAL_COMMUNITY): Payer: Self-pay

## 2021-03-25 MED ORDER — LOSARTAN POTASSIUM 100 MG PO TABS
100.0000 mg | ORAL_TABLET | Freq: Every day | ORAL | 3 refills | Status: AC
Start: 2021-03-25 — End: ?

## 2021-06-12 ENCOUNTER — Other Ambulatory Visit (HOSPITAL_COMMUNITY): Payer: Self-pay

## 2021-06-12 ENCOUNTER — Other Ambulatory Visit: Payer: Self-pay | Admitting: Student

## 2021-06-12 DIAGNOSIS — I10 Essential (primary) hypertension: Secondary | ICD-10-CM

## 2021-06-22 ENCOUNTER — Other Ambulatory Visit (HOSPITAL_COMMUNITY): Payer: Self-pay

## 2021-07-28 IMAGING — US US THYROID
1 series · 13 of 25 positions shown · non-contrast
Comparison: None.

CLINICAL DATA: Palpable abnormality. Possible enlargement of the
left lobe of the thyroid on physical examination.

EXAM:
THYROID ULTRASOUND
TECHNIQUE: Ultrasound examination of the thyroid gland and adjacent soft
tissues was performed.

[Series 1: us thyroid · 0.07mm/px · 13 of 62 slices shown]
[im 1/62]
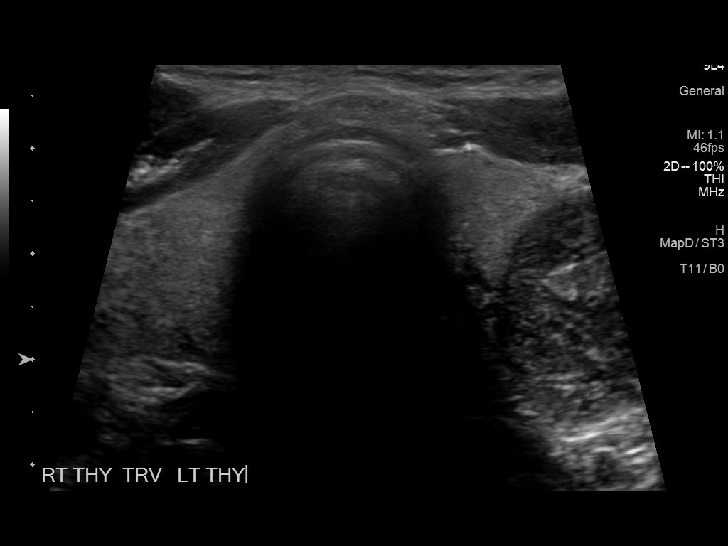
[im 6/62]
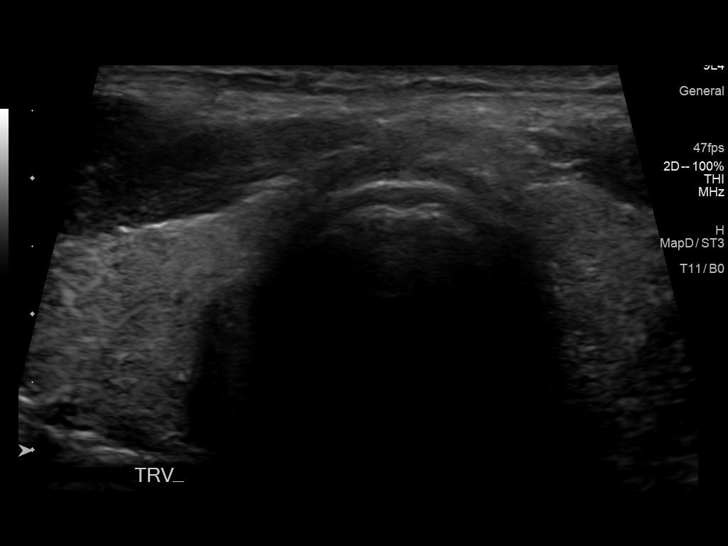
[im 11/62]
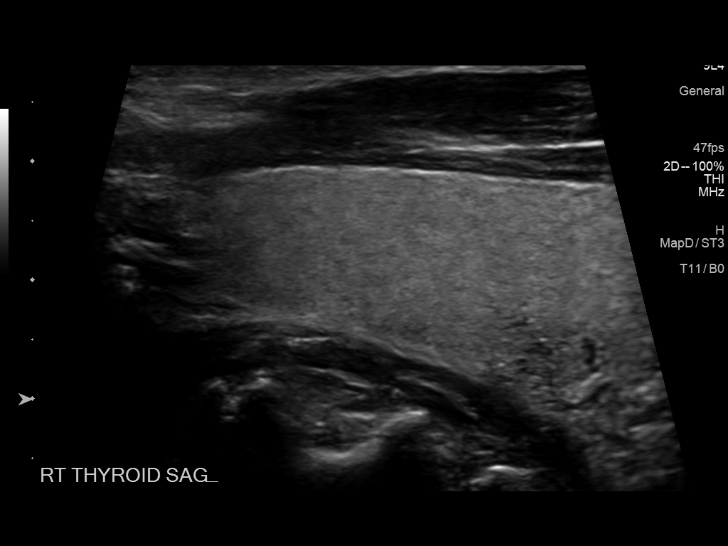
[im 16/62]
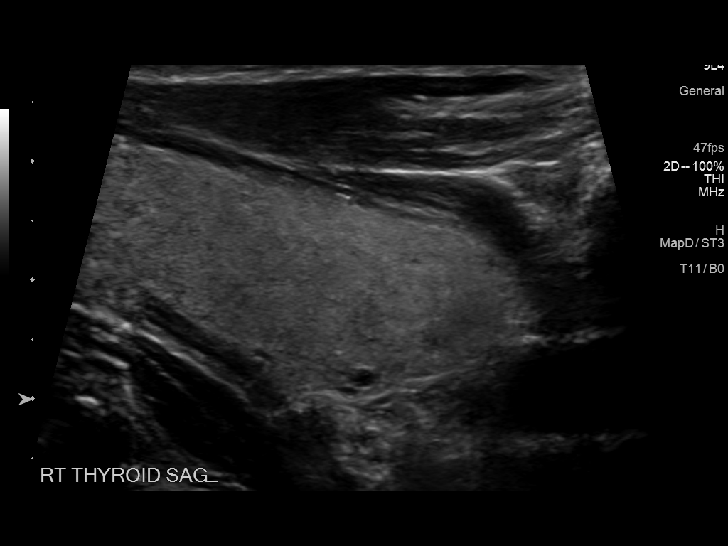
[im 21/62]
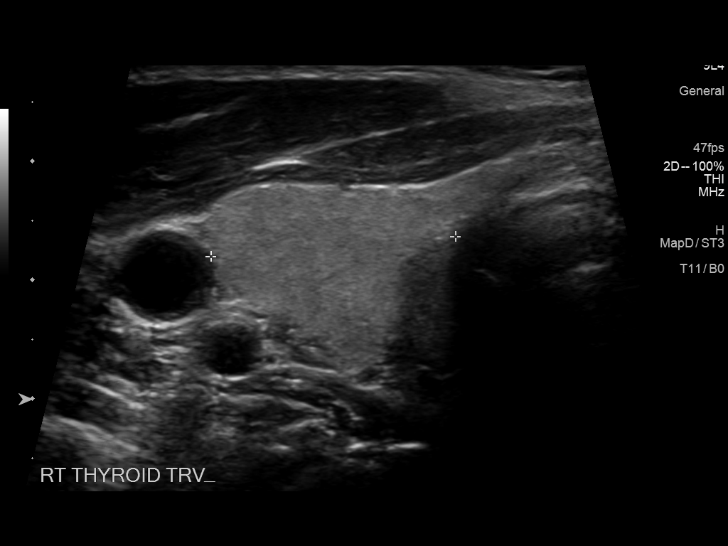
[im 26/62]
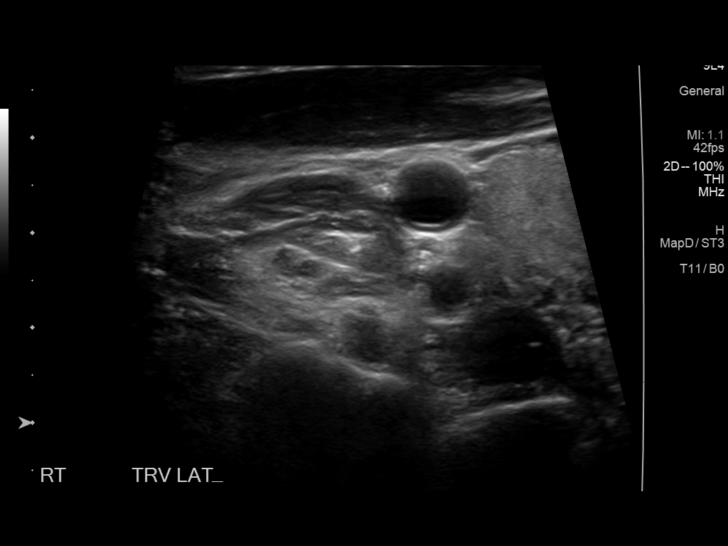
[im 31/62]
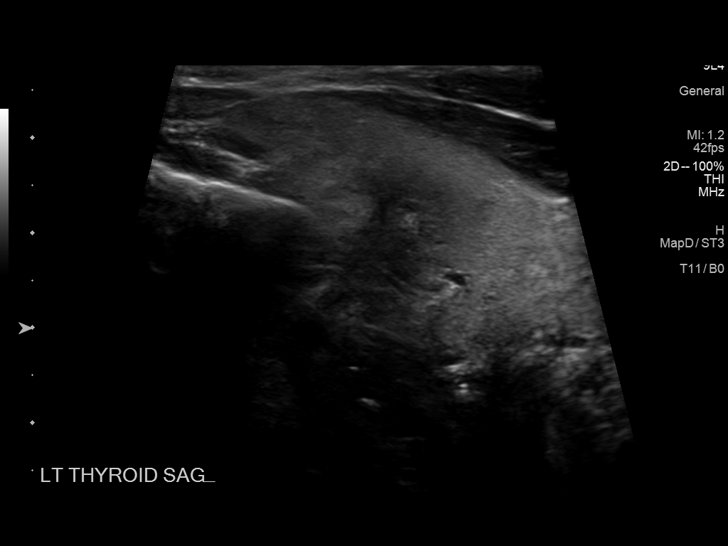
[im 36/62]
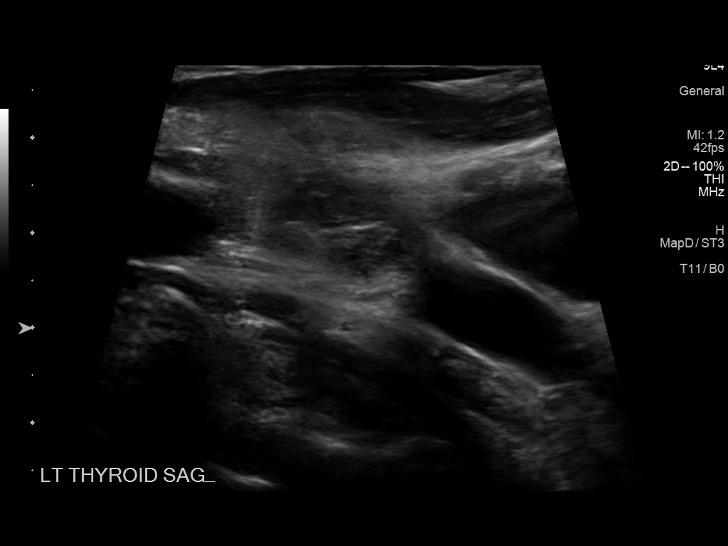
[im 41/62]
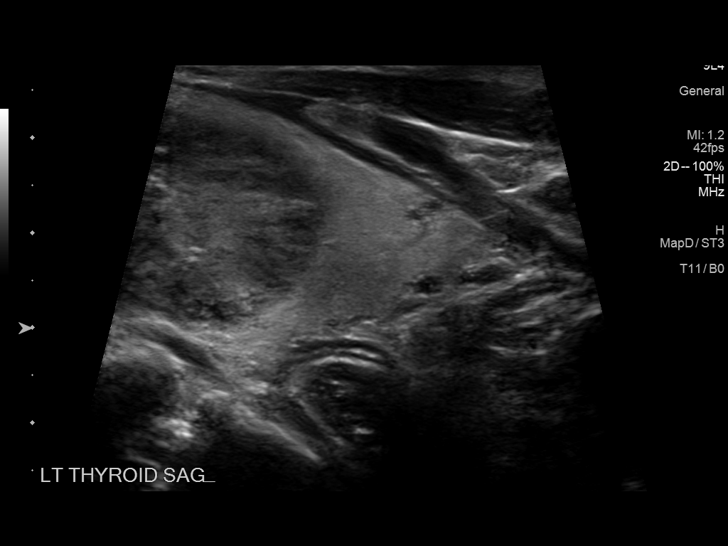
[im 46/62]
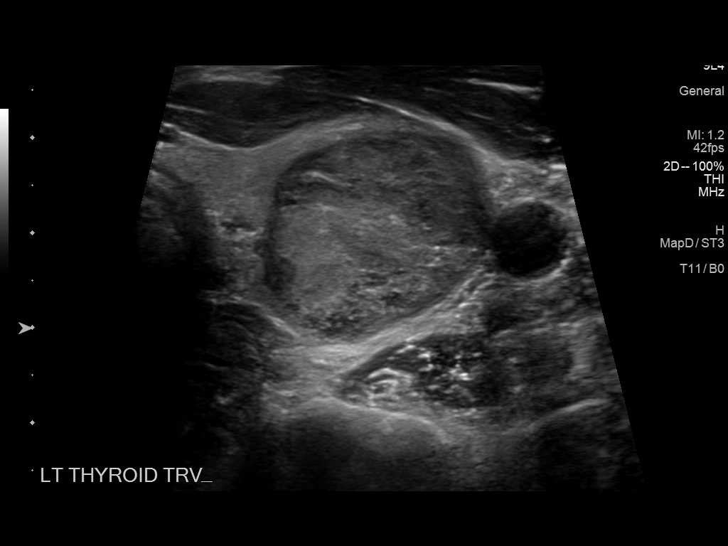
[im 51/62]
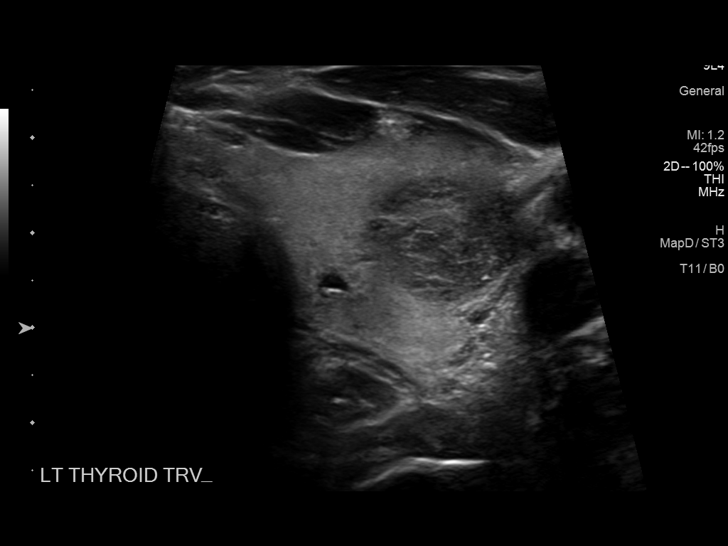
[im 56/62]
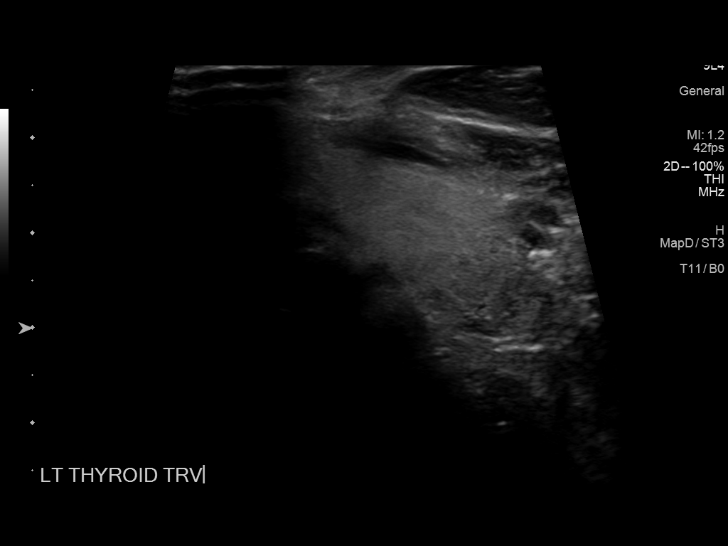
[im 62/62]
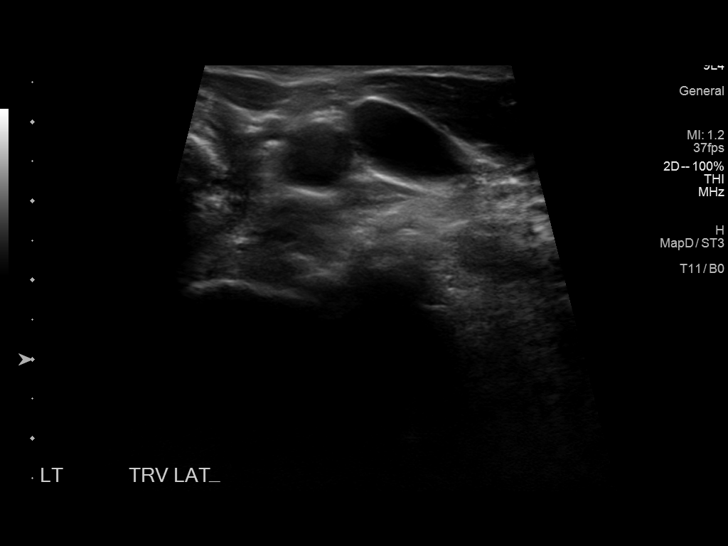

[13 of 25 positions shown; findings below may reference images not displayed]

FINDINGS: Parenchymal Echotexture: Normal

Isthmus: Normal in size measuring 0.3 cm in diameter

Right lobe: Normal in size measuring 4.7 x 1.9 x 2.1 cm

Left lobe: Enlarged measuring 5.1 x 2.6 x 3.2 cm

_________________________________________________________

Estimated total number of nodules >/= 1 cm: 2

Number of spongiform nodules >/=  2 cm not described below (TR1): 0

Number of mixed cystic and solid nodules >/= 1.5 cm not described
below (TR2): 0

_________________________________________________________

Nodule # 1:

Location: Left; Superior

Maximum size: 3.8 cm; Other 2 dimensions: 2.4 x 2.2 cm

Composition: solid/almost completely solid (2)

Echogenicity: hypoechoic (2)

Shape: not taller-than-wide (0)

Margins: smooth (0)

Echogenic foci: punctate echogenic foci (3)

ACR TI-RADS total points: 7.

ACR TI-RADS risk category: TR5 (>/= 7 points).

ACR TI-RADS recommendations:

**Given size (>/= 1.0 cm) and appearance, fine needle aspiration of
this highly suspicious nodule should be considered based on TI-RADS
criteria.

_________________________________________________________

There is an approximately 1.2 x 1.0 x 0.6 cm mixed cystic and solid
nodule within the inferior pole the left lobe of the thyroid
(labeled 2) which does not meet criteria to recommend percutaneous
sampling or continued dedicated follow-up.
IMPRESSION: Nodule #1, likely correlating with the palpable area of concern,
meets imaging criteria to recommend percutaneous sampling as
clinically indicated.

The above is in keeping with the ACR TI-RADS recommendations - [HOSPITAL] 7565;[DATE].
